# Patient Record
Sex: Male | Born: 1980 | Race: Black or African American | Hispanic: No | Marital: Single | Smoking: Former smoker
Health system: Southern US, Community
[De-identification: ages and names within clinical notes are randomized; demographics above are authoritative.]

## PROBLEM LIST (undated history)

## (undated) DIAGNOSIS — M1990 Unspecified osteoarthritis, unspecified site: Secondary | ICD-10-CM

## (undated) DIAGNOSIS — I10 Essential (primary) hypertension: Secondary | ICD-10-CM

## (undated) HISTORY — PX: KNEE ARTHROPLASTY: SHX992

## (undated) HISTORY — DX: Unspecified osteoarthritis, unspecified site: M19.90

---

## 2011-08-06 ENCOUNTER — Ambulatory Visit: Payer: Medicaid Other

## 2011-08-12 ENCOUNTER — Ambulatory Visit: Payer: Medicaid Other | Attending: Anesthesiology | Admitting: Physical Therapy

## 2011-10-14 ENCOUNTER — Emergency Department (HOSPITAL_COMMUNITY)
Admission: EM | Admit: 2011-10-14 | Discharge: 2011-10-14 | Disposition: A | Discharge status: Home / Self Care | Payer: Medicaid Other | Attending: Emergency Medicine | Admitting: Emergency Medicine

## 2011-10-14 ENCOUNTER — Encounter (HOSPITAL_COMMUNITY): Payer: Self-pay | Admitting: Emergency Medicine

## 2011-10-14 DIAGNOSIS — Y92009 Unspecified place in unspecified non-institutional (private) residence as the place of occurrence of the external cause: Secondary | ICD-10-CM | POA: Insufficient documentation

## 2011-10-14 DIAGNOSIS — W57XXXA Bitten or stung by nonvenomous insect and other nonvenomous arthropods, initial encounter: Secondary | ICD-10-CM

## 2011-10-14 DIAGNOSIS — L089 Local infection of the skin and subcutaneous tissue, unspecified: Secondary | ICD-10-CM | POA: Insufficient documentation

## 2011-10-14 MED ORDER — CEPHALEXIN 500 MG PO CAPS: 500.0000 mg | ORAL_CAPSULE | Freq: Three times a day (TID) | ORAL | Status: AC

## 2011-10-14 MED ORDER — CEPHALEXIN 250 MG PO CAPS
500.0000 mg | ORAL_CAPSULE | Freq: Once | ORAL | Status: AC
  Administered 2011-10-14: 500 mg via ORAL
  Filled 2011-10-14: qty 2

## 2011-10-14 NOTE — ED Notes (Signed)
The pt has pain in his rt arm swollen and difficult to move

## 2011-10-14 NOTE — ED Provider Notes (Signed)
History     CSN: [id number]  Arrival date & time 10/14/11  4098   First MD Initiated Contact with Patient 10/14/11 2050      Chief Complaint  Patient presents with  . Skin Problem    (Consider location/radiation/quality/duration/timing/severity/associated sxs/prior treatment) HPI Comments: Patient states that he thinks that he is getting bitten in the night by unknown insect - states awoke this morning with 3 small red marks on his right arm but as the day progressed the redness and swelling has increased and spread - he denies fever, chills, nausea, vomiting, numbness or tingling - he has full ROM to all joints.  Patient is a 31 y.o. male presenting with rash. The history is provided by the patient. No language interpreter was used.  Rash  This is a new problem. The current episode started 2 days ago. The problem has not changed since onset.The problem is associated with an insect bite/sting. There has been no fever. The rash is present on the right arm. The pain is at a severity of 3/10. The pain is moderate. The pain has been constant since onset. Associated symptoms include itching and pain. Pertinent negatives include no blisters and no weeping. He has tried nothing for the symptoms. The treatment provided no relief.    History reviewed. No pertinent past medical history.  History reviewed. No pertinent past surgical history.  History reviewed. No pertinent family history.  History  Substance Use Topics  . Smoking status: Not on file  . Smokeless tobacco: Not on file  . Alcohol Use: 3.6 oz/week    6 Cans of beer per week      Review of Systems  Constitutional: Negative for fever and chills.  HENT: Negative for neck pain.   Eyes: Negative for pain.  Respiratory: Negative for chest tightness and shortness of breath.   Cardiovascular: Negative for chest pain.  Gastrointestinal: Negative for nausea, vomiting and abdominal pain.  Genitourinary: Negative for dysuria.    Musculoskeletal: Negative for back pain.  Skin: Positive for itching and rash.  Neurological: Negative for headaches.  All other systems reviewed and are negative.    Allergies  Review of patient's allergies indicates no known allergies.  Home Medications  No current outpatient prescriptions on file.  BP 131/71  Pulse 75  Temp 98.4 F (36.9 C) (Oral)  Resp 18  SpO2 95%  Physical Exam  Nursing note and vitals reviewed. Constitutional: He is oriented to person, place, and time. He appears well-developed and well-nourished. No distress.  HENT:  Head: Normocephalic and atraumatic.  Right Ear: External ear normal.  Left Ear: External ear normal.  Nose: Nose normal.  Mouth/Throat: Oropharynx is clear and moist. No oropharyngeal exudate.  Eyes: Conjunctivae are normal. Pupils are equal, round, and reactive to light. No scleral icterus.  Neck: Normal range of motion. Neck supple.  Cardiovascular: Normal rate, regular rhythm and normal heart sounds.  Exam reveals no gallop and no friction rub.   No murmur heard. Pulmonary/Chest: Effort normal and breath sounds normal. No respiratory distress. He has no wheezes. He has no rales.  Abdominal: Soft. Bowel sounds are normal. He exhibits no distension. There is no tenderness.  Musculoskeletal: Normal range of motion. He exhibits edema and tenderness.  Lymphadenopathy:    He has no cervical adenopathy.  Neurological: He is alert and oriented to person, place, and time. No cranial nerve deficit.  Skin: Skin is warm and dry. No rash noted. There is erythema. No pallor.  Three small insect bites with 3 cm induration surrounding - no fluctuance.  Psychiatric: He has a normal mood and affect. His behavior is normal. Judgment and thought content normal.    ED Course  Procedures (including critical care time)  Labs Reviewed - No data to display No results found.   Infected insect bites    MDM  Patient with insect bites with  surrounding cellulitis - no evidence of systemic infection - will place on abx.        Izola Price [place], [place] 10/14/11 2101

## 2011-10-14 NOTE — ED Notes (Signed)
Pt reports woke up this morning and right arm was swollen and there was a bite. He reports tightness and soreness in that arm.

## 2011-10-15 NOTE — ED Provider Notes (Signed)
Medical screening examination/treatment/procedure(s) were performed by non-physician practitioner and as supervising physician I was immediately available for consultation/collaboration.  Flint Melter, MD 10/15/11 [phone]

## 2013-07-13 ENCOUNTER — Ambulatory Visit (INDEPENDENT_AMBULATORY_CARE_PROVIDER_SITE_OTHER): Payer: Medicaid Other | Admitting: Podiatry

## 2013-07-13 ENCOUNTER — Encounter: Payer: Self-pay | Admitting: Podiatry

## 2013-07-13 ENCOUNTER — Ambulatory Visit: Payer: Medicaid Other

## 2013-07-13 ENCOUNTER — Ambulatory Visit (INDEPENDENT_AMBULATORY_CARE_PROVIDER_SITE_OTHER): Payer: Medicaid Other

## 2013-07-13 VITALS — BP 142/78 | HR 75 | Resp 19 | Ht 74.0 in | Wt 266.0 lb

## 2013-07-13 DIAGNOSIS — Q6650 Congenital pes planus, unspecified foot: Secondary | ICD-10-CM

## 2013-07-13 DIAGNOSIS — M79609 Pain in unspecified limb: Secondary | ICD-10-CM

## 2013-07-13 DIAGNOSIS — M79606 Pain in leg, unspecified: Secondary | ICD-10-CM

## 2013-07-13 DIAGNOSIS — Q828 Other specified congenital malformations of skin: Secondary | ICD-10-CM

## 2013-07-13 MED ORDER — MELOXICAM 15 MG PO TABS: 15.0000 mg | ORAL_TABLET | Freq: Every day | ORAL | Status: DC

## 2013-07-13 MED ORDER — METHYLPREDNISOLONE (PAK) 4 MG PO TABS
ORAL_TABLET | ORAL | Status: DC
Start: 2013-07-13 — End: 2015-05-04

## 2013-07-13 NOTE — Progress Notes (Signed)
   Subjective:    Patient ID: Max Leach, male    DOB: 07/12/1980, 33 y.o.   MRN: [id number] Pt states has been having bottom of the feet burning, stinging, and occasional dull pain for over 1 month, and left lateral midfoot pain.  Pt states has to walk for his transportation.  Pt has a history of knee problems, and has been seen at [facility]. HPI    Review of Systems  Constitutional: Negative.   HENT: Negative.   Eyes: Negative.   Respiratory: Negative.   Cardiovascular: Positive for leg swelling.       Calf pain due to changing gait due to knee problems per pt.  Gastrointestinal: Negative.   Endocrine: Positive for polydipsia.  Genitourinary: Negative.   Musculoskeletal: Positive for arthralgias, back pain, gait problem and myalgias.  Skin: Negative.   Allergic/Immunologic: Positive for food allergies.       Crab causes facial swelling and rash.  Neurological: Positive for light-headedness and numbness.  Hematological: Bruises/bleeds easily.  Psychiatric/Behavioral: Negative.        Objective:   Physical Exam: I have reviewed his past history medications allergies surgeries and social history. Also stable he is alert and oriented x3. Pulses are palpable bilateral. Neurologic sensorium is intact bilateral. Orthopedic evaluation demonstrates all joints distal to the ankle a full range of motion with flexible pes planus bilateral. Cutaneous evaluation demonstrates dry xerotic tissue with porokeratotic lesions to the distal aspect of the right foot lateral aspect of the left foot which are painful on debridement today. He also has pain on range of motion of the subtalar joint and the forefoot more than likely associated with accommodating his painful lesions.        Assessment & Plan:  Assessment: Porokeratosis and pes planus bilateral.  Plan: Debridement reactive hyperkeratosis for him today and applied salicylic acid under occlusion to be left on for 3 days before  being removed I also wrote her prescription for a Medrol Dosepak and I will followup with him in 4 weeks

## 2013-08-12 ENCOUNTER — Encounter: Payer: Self-pay | Admitting: Podiatry

## 2013-08-12 ENCOUNTER — Ambulatory Visit (INDEPENDENT_AMBULATORY_CARE_PROVIDER_SITE_OTHER): Payer: Medicaid Other | Admitting: Podiatry

## 2013-08-12 VITALS — BP 132/78 | HR 75 | Resp 16

## 2013-08-12 DIAGNOSIS — Q828 Other specified congenital malformations of skin: Secondary | ICD-10-CM

## 2013-08-12 DIAGNOSIS — D492 Neoplasm of unspecified behavior of bone, soft tissue, and skin: Secondary | ICD-10-CM

## 2013-08-12 MED ORDER — DICLOFENAC SODIUM 75 MG PO TBEC: 75.0000 mg | DELAYED_RELEASE_TABLET | Freq: Two times a day (BID) | ORAL | Status: DC

## 2013-08-12 NOTE — Progress Notes (Signed)
He presents today for followup of his pes planus as well as his porokeratotic lesions to the posterior aspect of the bilateral foot. He states that the prednisone worked very well however [employer] doesn't seem to be doing very much for him.  Objective: Vital signs are stable he is alert and oriented x3 pulses are palpable. Porokeratotic lesions particularly one of the left foot is feeling 100% better however the right foot which did not heal off with the acid treatment is still painful. I debrided back down to normal tissue today and his symptoms were relieved 100%.  Assessment: Porokeratosis bilateral.  Plan: Wrote another prescription for diclofenac discontinue medic debridement reactive hyperkeratosis his and applied salicylic acid under occlusion for 4 days. I will followup with him in one month for repeat debridement and consider orthotics.

## 2013-09-16 ENCOUNTER — Ambulatory Visit (INDEPENDENT_AMBULATORY_CARE_PROVIDER_SITE_OTHER): Payer: Medicaid Other | Admitting: Podiatry

## 2013-09-16 ENCOUNTER — Encounter: Payer: Self-pay | Admitting: Podiatry

## 2013-09-16 VITALS — BP 122/68 | HR 78 | Resp 16

## 2013-09-16 DIAGNOSIS — Q828 Other specified congenital malformations of skin: Secondary | ICD-10-CM

## 2013-09-16 DIAGNOSIS — Q6650 Congenital pes planus, unspecified foot: Secondary | ICD-10-CM

## 2013-09-16 DIAGNOSIS — M79609 Pain in unspecified limb: Secondary | ICD-10-CM

## 2013-09-16 DIAGNOSIS — M79606 Pain in leg, unspecified: Secondary | ICD-10-CM

## 2013-09-16 DIAGNOSIS — D492 Neoplasm of unspecified behavior of bone, soft tissue, and skin: Secondary | ICD-10-CM

## 2013-09-16 NOTE — Progress Notes (Signed)
He presents today for followup of the porokeratosis plantar aspect of the right forefoot and the left plantar lateral foot. His complaining of numbness and some sharp pains to the first metatarsophalangeal joint and the IP joint of the right hallux. He denies fever chills nausea vomiting states it is the feeling much better.  Objective: Vital signs are stable he is alert and oriented x3 pulses are strongly palpable bilateral. Porokeratotic lesions plantar aspect of the bilateral foot the nucleated today and salicylic acid under occlusion was applied. I evaluated the first metatarsophalangeal joint which does demonstrate hallux abductovalgus deformity as well as hallux limitus. It is this limitation in range of motion at the metatarsophalangeal joint is resulting in a reactive hyperkeratosis to the medial IP joint and the neuritis tear and associated.  Assessment: Is hallux limitus first metatarsophalangeal joint bilateral. Porokeratosis plantar aspect of the bilateral foot.  Plan: Chemical destruction of lesion plantar aspect of the bilateral foot we did discuss the possible need for surgical intervention regarding the first metatarsophalangeal joint bilateral. He would like to consider this sometime in the fall.

## 2013-10-28 ENCOUNTER — Ambulatory Visit (INDEPENDENT_AMBULATORY_CARE_PROVIDER_SITE_OTHER): Payer: Medicaid Other | Admitting: Podiatry

## 2013-10-28 DIAGNOSIS — M79609 Pain in unspecified limb: Secondary | ICD-10-CM

## 2013-10-28 DIAGNOSIS — M79606 Pain in leg, unspecified: Secondary | ICD-10-CM

## 2013-10-28 DIAGNOSIS — Q828 Other specified congenital malformations of skin: Secondary | ICD-10-CM

## 2013-10-28 NOTE — Progress Notes (Signed)
He presents today for followup of his porokeratosis to the plantar aspect of the bilateral foot. He states him in the majority of the pain the right foot seems to have moved to the left foot nail.  Objective: Vital signs are stable he is alert and oriented x3. Pulses are palpable bilateral. Sub-second metatarsal head porokeratotic is present but much less thick than the last time he was in. This is looking much better. However the contralateral foot sub-fifth metatarsal base does demonstrate a porokeratotic lesion it is exquisitely tender on palpation.  Assessment: Porokeratosis bilateral.  Plan: Chemical destruction soft tissue benign lesion porokeratotic lesion with salicylic acid under occlusion. She will leave this on for 3 days keep it dry and then washed off thoroughly I will followup with him in 6-8 weeks.

## 2013-12-30 ENCOUNTER — Ambulatory Visit: Payer: Medicaid Other | Admitting: Podiatry

## 2014-01-13 ENCOUNTER — Ambulatory Visit: Payer: Medicaid Other | Admitting: Podiatry

## 2014-03-07 ENCOUNTER — Other Ambulatory Visit: Payer: Self-pay | Admitting: Podiatry

## 2015-05-02 ENCOUNTER — Ambulatory Visit: Payer: Medicaid Other | Admitting: Podiatry

## 2015-05-04 ENCOUNTER — Encounter: Payer: Self-pay | Admitting: Podiatry

## 2015-05-04 ENCOUNTER — Ambulatory Visit (INDEPENDENT_AMBULATORY_CARE_PROVIDER_SITE_OTHER): Payer: Medicaid Other | Admitting: Podiatry

## 2015-05-04 VITALS — BP 122/62 | HR 66 | Resp 16

## 2015-05-04 DIAGNOSIS — Q828 Other specified congenital malformations of skin: Secondary | ICD-10-CM

## 2015-05-04 DIAGNOSIS — D492 Neoplasm of unspecified behavior of bone, soft tissue, and skin: Secondary | ICD-10-CM | POA: Diagnosis not present

## 2015-05-04 DIAGNOSIS — Q6650 Congenital pes planus, unspecified foot: Secondary | ICD-10-CM

## 2015-05-04 NOTE — Progress Notes (Signed)
He presents today for follow-up of his porokeratotic lesions plantar aspect of bilateral foot. He states that they get very good this time approximate 7 months it lasted.  Objective: Pulses are palpable. Reactive porokeratotic lesions plantar aspect sub-second right inside IP joint hallux right are the worst and he does have smaller lesions throughout the left foot as well.  Assessment: Porokeratosis bilateral pain in limb bilateral.  Plan: Chemical debridement of these lesions after mechanical debridement of the lesions. Follow up with him as needed.

## 2015-08-18 ENCOUNTER — Encounter (HOSPITAL_COMMUNITY): Payer: Self-pay | Admitting: Emergency Medicine

## 2015-08-18 DIAGNOSIS — M79632 Pain in left forearm: Secondary | ICD-10-CM | POA: Diagnosis not present

## 2015-08-18 DIAGNOSIS — R600 Localized edema: Secondary | ICD-10-CM | POA: Insufficient documentation

## 2015-08-18 DIAGNOSIS — F17200 Nicotine dependence, unspecified, uncomplicated: Secondary | ICD-10-CM | POA: Diagnosis not present

## 2015-08-18 DIAGNOSIS — M7989 Other specified soft tissue disorders: Secondary | ICD-10-CM | POA: Diagnosis present

## 2015-08-18 LAB — CBC WITH DIFFERENTIAL/PLATELET
BASOS ABS: 0 10*3/uL (ref 0.0–0.1)
BASOS PCT: 0 %
Eosinophils Absolute: 0.3 10*3/uL (ref 0.0–0.7)
Eosinophils Relative: 5 %
HEMATOCRIT: 37.1 % — AB (ref 39.0–52.0)
HEMOGLOBIN: 12.4 g/dL — AB (ref 13.0–17.0)
LYMPHS PCT: 36 %
Lymphs Abs: 1.9 10*3/uL (ref 0.7–4.0)
MCH: 27 pg (ref 26.0–34.0)
MCHC: 33.4 g/dL (ref 30.0–36.0)
MCV: 80.8 fL (ref 78.0–100.0)
Monocytes Absolute: 0.7 10*3/uL (ref 0.1–1.0)
Monocytes Relative: 13 %
NEUTROS ABS: 2.4 10*3/uL (ref 1.7–7.7)
NEUTROS PCT: 46 %
Platelets: 171 10*3/uL (ref 150–400)
RBC: 4.59 MIL/uL (ref 4.22–5.81)
RDW: 13.7 % (ref 11.5–15.5)
WBC: 5.2 10*3/uL (ref 4.0–10.5)

## 2015-08-18 LAB — COMPREHENSIVE METABOLIC PANEL
ALBUMIN: 3.6 g/dL (ref 3.5–5.0)
ALK PHOS: 58 U/L (ref 38–126)
ALT: 22 U/L (ref 17–63)
AST: 32 U/L (ref 15–41)
Anion gap: 9 (ref 5–15)
BILIRUBIN TOTAL: 0.4 mg/dL (ref 0.3–1.2)
BUN: 13 mg/dL (ref 6–20)
CALCIUM: 9.2 mg/dL (ref 8.9–10.3)
CO2: 24 mmol/L (ref 22–32)
CREATININE: 1.09 mg/dL (ref 0.61–1.24)
Chloride: 110 mmol/L (ref 101–111)
GFR calc Af Amer: >60 mL/min (ref >60)
GLUCOSE: 127 mg/dL — AB (ref 65–99)
POTASSIUM: 4 mmol/L (ref 3.5–5.1)
Sodium: 143 mmol/L (ref 135–145)
TOTAL PROTEIN: 6.5 g/dL (ref 6.5–8.1)

## 2015-08-18 NOTE — ED Notes (Signed)
Pt. reports bilateral lower legs edema/swelling onset this week with intermittent left forearm pain, denies injury , no SOB .

## 2015-08-19 ENCOUNTER — Emergency Department (HOSPITAL_COMMUNITY)
Admission: EM | Admit: 2015-08-19 | Discharge: 2015-08-19 | Disposition: A | Discharge status: Home / Self Care | Payer: Medicaid Other | Attending: Emergency Medicine | Admitting: Emergency Medicine

## 2015-08-19 ENCOUNTER — Encounter (HOSPITAL_COMMUNITY): Payer: Self-pay | Admitting: Emergency Medicine

## 2015-08-19 DIAGNOSIS — R609 Edema, unspecified: Secondary | ICD-10-CM

## 2015-08-19 DIAGNOSIS — T148XXA Other injury of unspecified body region, initial encounter: Secondary | ICD-10-CM

## 2015-08-19 MED ORDER — NAPROXEN 375 MG PO TABS: 375.0000 mg | ORAL_TABLET | Freq: Two times a day (BID) | ORAL | Status: DC

## 2015-08-19 MED ORDER — NAPROXEN 250 MG PO TABS
500.0000 mg | ORAL_TABLET | Freq: Once | ORAL | Status: AC
  Administered 2015-08-19: 500 mg via ORAL
  Filled 2015-08-19: qty 2

## 2015-08-19 NOTE — Discharge Instructions (Signed)
Edema °Edema is an abnormal buildup of fluids. It is more common in your legs and thighs. Painless swelling of the feet and ankles is more likely as a person ages. It also is common in looser skin, like around your eyes. °HOME CARE  °· Keep the affected body part above the level of the heart while lying down. °· Do not sit still or stand for a long time. °· Do not put anything right under your knees when you lie down. °· Do not wear tight clothes on your upper legs. °· Exercise your legs to help the puffiness (swelling) go down. °· Wear elastic bandages or support stockings as told by your doctor. °· A low-salt diet may help lessen the puffiness. °· Only take medicine as told by your doctor. °GET HELP IF: °· Treatment is not working. °· You have heart, liver, or kidney disease and notice that your skin looks puffy or shiny. °· You have puffiness in your legs that does not get better when you raise your legs. °· You have sudden weight gain for no reason. °GET HELP RIGHT AWAY IF:  °· You have shortness of breath or chest pain. °· You cannot breathe when you lie down. °· You have pain, redness, or warmth in the areas that are puffy. °· You have heart, liver, or kidney disease and get edema all of a sudden. °· You have a fever and your symptoms get worse all of a sudden. °MAKE SURE YOU:  °· Understand these instructions. °· Will watch your condition. °· Will get help right away if you are not doing well or get worse. °  °This information is not intended to replace advice given to you by your health care provider. Make sure you discuss any questions you have with your health care provider. °  °Document Released: 10/02/2007 Document Revised: 04/20/2013 Document Reviewed: 02/05/2013 °Elsevier Interactive Patient Education ©2016 [employer] ° °

## 2015-08-19 NOTE — ED Provider Notes (Signed)
CSN: [id number]     Arrival date & time 08/18/15  2119 History   By signing my name below, I, Max Leach, attest that this documentation has been prepared under the direction and in the presence of Max Hoston, MD.  Electronically Signed: Forrestine Leach, ED Scribe. 08/19/2015. 2:09 AM.   Chief Complaint  Patient presents with  . Leg Swelling   Patient is a 35 y.o. male presenting with extremity pain. The history is provided by the patient. No language interpreter was used.  Extremity Pain This is a new problem. The current episode started yesterday. The problem occurs constantly. The problem has not changed since onset.Pertinent negatives include no chest pain, no abdominal pain, no headaches and no shortness of breath. Nothing aggravates the symptoms. Nothing relieves the symptoms. He has tried nothing for the symptoms.    HPI Comments: Max Leach is a 35 y.o. male without any pertinent past medical history who presents to the Emergency Department complaining of intermittent, ongoing swelling to bilateral lower extremities at the ankles; R greater than L  x 1 week. No recent injury or trauma. No aggravating or alleviating factors reported. Pt also reports ongoing pain to the L forearm x 2-3 days. No OTC medication or home remedies attempted prior to arrival. No recent fever, chills, nausea, vomiting, or diarrhea. Pt admits to walking a lot and often lifts weight at the gym. No known allergies to medications. No CP no SOB no DOE no pain in the legs.  Pain in the left forearm post push ups  PCP: Max Fendt, MD    Past Medical History  Diagnosis Date  . Arthritis     r knee   Past Surgical History  Procedure Laterality Date  . Knee arthroplasty Right    Family History  Problem Relation Age of Onset  . Arthritis Mother    Social History  Substance Use Topics  . Smoking status: Current Every Day Smoker -- 0.00 packs/day  . Smokeless tobacco: None  . Alcohol Use: Yes     Review of Systems  Respiratory: Negative for cough and shortness of breath.   Cardiovascular: Positive for leg swelling. Negative for chest pain.  Gastrointestinal: Negative for nausea, vomiting, abdominal pain and diarrhea.  Musculoskeletal: Positive for arthralgias.  Neurological: Negative for headaches.  Psychiatric/Behavioral: Negative for confusion.  All other systems reviewed and are negative.     Allergies  Shrimp  Home Medications   Prior to Admission medications   Medication Sig Start Date End Date Taking? Authorizing Provider  cyclobenzaprine (FLEXERIL) 10 MG tablet Take 10 mg by mouth 3 (three) times daily as needed for muscle spasms.    Historical Provider, MD   Triage Vitals: BP 128/79 mmHg  Pulse 56  Temp(Src) 97.7 F (36.5 C) (Oral)  Resp 22  Ht 6\' 2"  (1.88 m)  Wt 296 lb (134.265 kg)  BMI 37.99 kg/m2  SpO2 97%   Physical Exam  Constitutional: He is oriented to person, place, and time. He appears well-developed and well-nourished.  HENT:  Head: Normocephalic and atraumatic.  Mouth/Throat: Oropharynx is clear and moist.  Eyes: EOM are normal. Pupils are equal, round, and reactive to light.  Neck: Normal range of motion. Neck supple.  Trachea is midline No bruits   Cardiovascular: Normal rate, regular rhythm, normal heart sounds and intact distal pulses.   Pulses:      Dorsalis pedis pulses are 2+ on the right side, and 2+ on the left side.  Pulmonary/Chest: Effort  normal and breath sounds normal. No stridor. No respiratory distress. He has no wheezes. He has no rales.  Abdominal: Soft. Bowel sounds are normal. He exhibits no distension. There is no tenderness. There is no rebound and no guarding.  Musculoskeletal: Normal range of motion. He exhibits edema. He exhibits no tenderness.  Dependant edema, non pitting  Negative Homan's sign all compartments soft.   No snuff box left wrist L arm is N/V Triceps and biceps intact at 5/5 bilaterally    Neurological: He is alert and oriented to person, place, and time. He has normal reflexes.  Skin: Skin is warm and dry.  Psychiatric: He has a normal mood and affect. Judgment normal.  Nursing note and vitals reviewed.   ED Course  Procedures (including critical care time)  DIAGNOSTIC STUDIES: Oxygen Saturation is 97% on RA, Normal by my interpretation.    COORDINATION OF CARE: 2:02 AM- Will order blood work. Discussed treatment plan with pt at bedside and pt agreed to plan.     Labs Review Labs Reviewed  CBC WITH DIFFERENTIAL/PLATELET - Abnormal; Notable for the following:    Hemoglobin 12.4 (*)    HCT 37.1 (*)    All other components within normal limits  COMPREHENSIVE METABOLIC PANEL - Abnormal; Notable for the following:    Glucose, Bld 127 (*)    All other components within normal limits    Imaging Review No results found. I have personally reviewed and evaluated these images and lab results as part of my medical decision-making.   EKG Interpretation None      MDM   Final diagnoses:  None     Filed Vitals:   08/19/15 0007 08/19/15 0205  BP: 128/79 125/80  Pulse: 56 58  Temp: 97.7 F (36.5 C)   Resp: 22 18    Results for orders placed or performed during the hospital encounter of 08/19/15  CBC with Differential  Result Value Ref Range   WBC 5.2 4.0 - 10.5 K/uL   RBC 4.59 4.22 - 5.81 MIL/uL   Hemoglobin 12.4 (L) 13.0 - 17.0 g/dL   HCT 37.1 (L) 39.0 - 52.0 %   MCV 80.8 78.0 - 100.0 fL   MCH 27.0 26.0 - 34.0 pg   MCHC 33.4 30.0 - 36.0 g/dL   RDW 13.7 11.5 - 15.5 %   Platelets 171 150 - 400 K/uL   Neutrophils Relative % 46 %   Neutro Abs 2.4 1.7 - 7.7 K/uL   Lymphocytes Relative 36 %   Lymphs Abs 1.9 0.7 - 4.0 K/uL   Monocytes Relative 13 %   Monocytes Absolute 0.7 0.1 - 1.0 K/uL   Eosinophils Relative 5 %   Eosinophils Absolute 0.3 0.0 - 0.7 K/uL   Basophils Relative 0 %   Basophils Absolute 0.0 0.0 - 0.1 K/uL  Comprehensive metabolic panel   Result Value Ref Range   Sodium 143 135 - 145 mmol/L   Potassium 4.0 3.5 - 5.1 mmol/L   Chloride 110 101 - 111 mmol/L   CO2 24 22 - 32 mmol/L   Glucose, Bld 127 (H) 65 - 99 mg/dL   BUN 13 6 - 20 mg/dL   Creatinine, Ser 1.09 0.61 - 1.24 mg/dL   Calcium 9.2 8.9 - 10.3 mg/dL   Total Protein 6.5 6.5 - 8.1 g/dL   Albumin 3.6 3.5 - 5.0 g/dL   AST 32 15 - 41 U/L   ALT 22 17 - 63 U/L   Alkaline Phosphatase 58 38 -  126 U/L   Total Bilirubin 0.4 0.3 - 1.2 mg/dL   GFR calc non Af Amer >60 >60 mL/min   GFR calc Af Amer >60 >60 mL/min   Anion gap 9 5 - 15    Filed Vitals:   08/19/15 0230 08/19/15 0245  BP: 140/76 134/87  Pulse: 48 54  Temp:    Resp:     Medications  naproxen (NAPROSYN) tablet 500 mg (500 mg Oral Given 08/19/15 0221)    MSK pain in the forearm NSAIDs and ice and hold push ups and heavy lifting.  Dependent edema elevate legs and ice ankles.  Close follow up strict return precautions  I personally performed the services described in this documentation, which was scribed in my presence. The recorded information has been reviewed and is accurate.     Veatrice Kells, MD 08/19/15 [phone]

## 2015-08-19 NOTE — ED Notes (Signed)
Pt departed in NAD.  

## 2015-10-05 ENCOUNTER — Encounter: Payer: Self-pay | Admitting: Podiatry

## 2015-10-05 ENCOUNTER — Ambulatory Visit (INDEPENDENT_AMBULATORY_CARE_PROVIDER_SITE_OTHER): Payer: Medicaid Other | Admitting: Podiatry

## 2015-10-05 DIAGNOSIS — Q828 Other specified congenital malformations of skin: Secondary | ICD-10-CM

## 2015-10-05 DIAGNOSIS — Q6650 Congenital pes planus, unspecified foot: Secondary | ICD-10-CM | POA: Diagnosis not present

## 2015-10-05 NOTE — Progress Notes (Signed)
He presents today chief complaint of painful feet bilateral. He states that he back to have his feet scraped.  Objective: All signs are stable he is alert and oriented 3. Pulses are palpable. Pes planus is noted bilateral with multiple porokeratosis.  Assessment: Multiple porokeratosis plantar aspect of the bilateral foot with pes planus.  Plan: I debrided the reactive hyperkeratotic lesions mechanically today and place salicylic acid under occlusion to be left on for 3 days without getting this wet. He is to then washed off the salicylic acid and follow up with me on an as-needed basis.

## 2015-11-21 ENCOUNTER — Ambulatory Visit (INDEPENDENT_AMBULATORY_CARE_PROVIDER_SITE_OTHER): Payer: Medicaid Other

## 2015-11-21 ENCOUNTER — Encounter: Payer: Self-pay | Admitting: Podiatry

## 2015-11-21 ENCOUNTER — Ambulatory Visit (INDEPENDENT_AMBULATORY_CARE_PROVIDER_SITE_OTHER): Payer: Medicaid Other | Admitting: Podiatry

## 2015-11-21 VITALS — BP 118/69 | HR 65 | Resp 16

## 2015-11-21 DIAGNOSIS — M779 Enthesopathy, unspecified: Secondary | ICD-10-CM | POA: Insufficient documentation

## 2015-11-21 DIAGNOSIS — M79672 Pain in left foot: Secondary | ICD-10-CM

## 2015-11-21 DIAGNOSIS — M7750 Other enthesopathy of unspecified foot: Secondary | ICD-10-CM | POA: Diagnosis not present

## 2015-11-21 MED ORDER — MELOXICAM 15 MG PO TABS: 15.0000 mg | ORAL_TABLET | Freq: Every day | ORAL | 3 refills | Status: DC

## 2015-11-22 NOTE — Progress Notes (Signed)
He presents today with chief complaint of pain dorsal aspect of the left foot. He states that it started after working out at the gym very hard last week. He denies any trauma to the foot no injuries.  Objective: Vital signs are stable alert and oriented 3. Pulses are palpable. Dorsal aspect of the foot is moderately swollen with some changes in skin color and texture around the metatarsophalangeal joints. It is painful for him to dorsiflex the toes or for me to manually plantarflex the toes. He has tenderness on palpation of the second third and fourth metatarsal heads. Radiographs taken today do not demonstrate any type of osseus abnormalities however we cannot be sure that there are stress fractures there. He has no pain on plantar palpation of each individual metatarsal head.  Assessment: This appears to be an extensor tendinitis of the left foot.  Plan: I recommended he follow up with [place] in a few weeks for another set of x-rays. Until then I recommended anti-inflammatories which is centered over meloxicam 15 mg #30 one by mouth daily.

## 2016-06-11 ENCOUNTER — Encounter (HOSPITAL_COMMUNITY): Payer: Self-pay | Admitting: *Deleted

## 2016-06-11 ENCOUNTER — Emergency Department (HOSPITAL_COMMUNITY): Payer: Medicaid Other

## 2016-06-11 ENCOUNTER — Emergency Department (HOSPITAL_COMMUNITY)
Admission: EM | Admit: 2016-06-11 | Discharge: 2016-06-11 | Disposition: A | Discharge status: Home / Self Care | Payer: Medicaid Other | Attending: Emergency Medicine | Admitting: Emergency Medicine

## 2016-06-11 DIAGNOSIS — M25561 Pain in right knee: Secondary | ICD-10-CM | POA: Insufficient documentation

## 2016-06-11 DIAGNOSIS — F17200 Nicotine dependence, unspecified, uncomplicated: Secondary | ICD-10-CM | POA: Insufficient documentation

## 2016-06-11 DIAGNOSIS — M25461 Effusion, right knee: Secondary | ICD-10-CM | POA: Diagnosis present

## 2016-06-11 DIAGNOSIS — Z79899 Other long term (current) drug therapy: Secondary | ICD-10-CM | POA: Insufficient documentation

## 2016-06-11 MED ORDER — PREDNISONE 10 MG (21) PO TBPK: 10.0000 mg | ORAL_TABLET | Freq: Every day | ORAL | 0 refills | Status: DC

## 2016-06-11 MED ORDER — PREDNISONE 20 MG PO TABS
60.0000 mg | ORAL_TABLET | Freq: Once | ORAL | Status: AC
  Administered 2016-06-11: 60 mg via ORAL
  Filled 2016-06-11: qty 3

## 2016-06-11 NOTE — ED Triage Notes (Signed)
Pt reports onset of swelling in both legs for 4 days and sharp pain/stiffnes in the right knee, denies injury.  Pt denies cp or sob. No meds prior to arrival.

## 2016-06-11 NOTE — Discharge Instructions (Signed)
Rest for the next 2-3 days. Take the medications as directed. Follow-up with orthopedic surgeon

## 2016-06-11 NOTE — ED Provider Notes (Signed)
[facility] DEPT Provider Note   CSN: [id number] Arrival date & time: 06/11/16  1953     History   Chief Complaint Chief Complaint  Patient presents with  . Leg Swelling    HPI Hobbs Parola is a 36 y.o. male.  36 year old male presents with right knee swelling times several days. History of meniscal tear with arthroscopic surgery 10 years ago. No fever or chills. No recent history of trauma. Pain characterized as sharp and localized to the infrapatellar region. Better with rest. Denies any calf or thigh pain or swelling. No treatment use prior to arrival.      Past Medical History:  Diagnosis Date  . Arthritis    r knee    Patient Active Problem List   Diagnosis Date Noted  . Tendinitis 11/21/2015    Past Surgical History:  Procedure Laterality Date  . KNEE ARTHROPLASTY Right        Home Medications    Prior to Admission medications   Medication Sig Start Date End Date Taking? Authorizing Provider  meloxicam (MOBIC) 15 MG tablet Take 1 tablet (15 mg total) by mouth daily. Patient not taking: Reported on 06/11/2016 11/21/15   Max T Hyatt, DPM  naproxen (NAPROSYN) 375 MG tablet Take 1 tablet (375 mg total) by mouth 2 (two) times daily. Patient not taking: Reported on 06/11/2016 08/19/15   April Palumbo, MD    Family History Family History  Problem Relation Age of Onset  . Arthritis Mother     Social History Social History  Substance Use Topics  . Smoking status: Current Every Day Smoker    Packs/day: 0.00  . Smokeless tobacco: Current User  . Alcohol use Yes     Allergies   Shrimp [shellfish allergy]   Review of Systems Review of Systems  All other systems reviewed and are negative.    Physical Exam Updated Vital Signs BP 135/78 (BP Location: Right Arm)   Pulse (!) 57   Temp 98.3 F (36.8 C) (Oral)   Resp 24   Ht 6\' 1"  (1.854 m)   Wt 116.1 kg   SpO2 99%   BMI 33.78 kg/m   Physical Exam  Constitutional: He is oriented to  person, place, and time. He appears well-developed and well-nourished.  Non-toxic appearance. No distress.  HENT:  Head: Normocephalic and atraumatic.  Eyes: Conjunctivae, EOM and lids are normal. Pupils are equal, round, and reactive to light.  Neck: Normal range of motion. Neck supple. No tracheal deviation present. No thyroid mass present.  Cardiovascular: Normal rate, regular rhythm and normal heart sounds.  Exam reveals no gallop.   No murmur heard. Pulmonary/Chest: Effort normal and breath sounds normal. No stridor. No respiratory distress. He has no decreased breath sounds. He has no wheezes. He has no rhonchi. He has no rales.  Abdominal: Soft. Normal appearance and bowel sounds are normal. He exhibits no distension. There is no tenderness. There is no rebound and no CVA tenderness.  Musculoskeletal: Normal range of motion. He exhibits no edema or tenderness.       Legs: Neurological: He is alert and oriented to person, place, and time. He has normal strength. No cranial nerve deficit or sensory deficit. GCS eye subscore is 4. GCS verbal subscore is 5. GCS motor subscore is 6.  Skin: Skin is warm and dry. No abrasion and no rash noted.  Psychiatric: He has a normal mood and affect. His speech is normal and behavior is normal.  Nursing note and vitals reviewed.  ED Treatments / Results  Labs (all labs ordered are listed, but only abnormal results are displayed) Labs Reviewed - No data to display  EKG  EKG Interpretation None       Radiology Dg Knee Complete 4 Views Right  Result Date: 06/11/2016 CLINICAL DATA:  Acute onset of sharp anterior right knee pain and swelling. Initial encounter. EXAM: RIGHT KNEE - COMPLETE 4+ VIEW COMPARISON:  None. FINDINGS: There is no evidence of fracture or dislocation. Prominent marginal osteophytes are seen arising at all 3 compartments, with underlying mild cortical irregularity and sclerosis of the medial compartment. Tibial spine and wall  osteophytes are also seen. A small to moderate knee joint effusion is noted, with an apparent large loose body superior to the patella. The visualized soft tissues are normal in appearance. IMPRESSION: 1. No evidence of acute fracture or dislocation. 2. Tricompartmental osteoarthritis noted. 3. Small to moderate knee joint effusion, with apparent large loose body superior to the patella. Electronically Signed   By: Garald Balding M.D.   On: 06/11/2016 21:27    Procedures Procedures (including critical care time)  Medications Ordered in ED Medications - No data to display   Initial Impression / Assessment and Plan / ED Course  I have reviewed the triage vital signs and the nursing notes.  Pertinent labs & imaging results that were available during my care of the patient were reviewed by me and considered in my medical decision making (see chart for details).     Patient's x-rays noted. He is afebrile here. He does have a history of arthritis in the same joint nothing as a cause of this fluid. Slight effusion noted but not enough to do an arthrocentesis. Will give dose of prednisone here as well as prescription for same referral to orthopedics. Leg is not swollen and do not think that this represents DVT.  Final Clinical Impressions(s) / ED Diagnoses   Final diagnoses:  None    New Prescriptions New Prescriptions   No medications on file     Lacretia Leigh, MD 06/11/16 2242

## 2016-06-11 NOTE — ED Notes (Signed)
Ice provided for knee

## 2017-07-03 ENCOUNTER — Ambulatory Visit (HOSPITAL_COMMUNITY)
Admission: EM | Admit: 2017-07-03 | Discharge: 2017-07-03 | Disposition: A | Discharge status: Home / Self Care | Payer: Medicaid Other | Attending: Family Medicine | Admitting: Family Medicine

## 2017-07-03 ENCOUNTER — Other Ambulatory Visit: Payer: Self-pay

## 2017-07-03 ENCOUNTER — Encounter (HOSPITAL_COMMUNITY): Payer: Self-pay | Admitting: Emergency Medicine

## 2017-07-03 DIAGNOSIS — K0889 Other specified disorders of teeth and supporting structures: Secondary | ICD-10-CM | POA: Diagnosis not present

## 2017-07-03 MED ORDER — HYDROCODONE-ACETAMINOPHEN 5-325 MG PO TABS
1.0000 | ORAL_TABLET | Freq: Once | ORAL | Status: AC
  Administered 2017-07-03: 1 via ORAL

## 2017-07-03 MED ORDER — HYDROCODONE-ACETAMINOPHEN 5-325 MG PO TABS
ORAL_TABLET | ORAL | Status: AC
  Filled 2017-07-03: qty 1

## 2017-07-03 MED ORDER — AMOXICILLIN 875 MG PO TABS: 875.0000 mg | ORAL_TABLET | Freq: Two times a day (BID) | ORAL | 0 refills | Status: AC

## 2017-07-03 MED ORDER — IBUPROFEN 800 MG PO TABS: 800.0000 mg | ORAL_TABLET | Freq: Three times a day (TID) | ORAL | 0 refills | Status: DC

## 2017-07-03 NOTE — ED Triage Notes (Signed)
Pt c/o toothache on the R side x2 days

## 2017-07-03 NOTE — ED Provider Notes (Signed)
[facility]    CSN: [id number] Arrival date & time: 07/03/17  1448     History   Chief Complaint Chief Complaint  Patient presents with  . Dental Pain    HPI Creg Gilmer is a 37 y.o. male.   Jeanmarc presents with complaints of right upper jaw pain which started yesterday. Some swelling. Has been taking ibuprofen which has not helped, last dose this morning. Denies any previous similar. Pain causes entire jaw to be painful. Without fevers. Does not have a dentist. No known drainage. Rates pain 10/10. Without URI symptoms. Without any contributing medical history.  ROS per HPI.       Past Medical History:  Diagnosis Date  . Arthritis    r knee    Patient Active Problem List   Diagnosis Date Noted  . Tendinitis 11/21/2015    Past Surgical History:  Procedure Laterality Date  . KNEE ARTHROPLASTY Right        Home Medications    Prior to Admission medications   Medication Sig Start Date End Date Taking? Authorizing Provider  amoxicillin (AMOXIL) 875 MG tablet Take 1 tablet (875 mg total) by mouth 2 (two) times daily for 10 days. 07/03/17 07/13/17  Zigmund Gottron, NP  ibuprofen (ADVIL,MOTRIN) 800 MG tablet Take 1 tablet (800 mg total) by mouth 3 (three) times daily. 07/03/17   Zigmund Gottron, NP    Family History Family History  Problem Relation Age of Onset  . Arthritis Mother     Social History Social History   Tobacco Use  . Smoking status: Current Every Day Smoker    Packs/day: 0.00  . Smokeless tobacco: Current User  Substance Use Topics  . Alcohol use: Yes  . Drug use: Yes    Types: Marijuana     Allergies   Shrimp [shellfish allergy]   Review of Systems Review of Systems   Physical Exam Triage Vital Signs ED Triage Vitals  Enc Vitals Group     BP 07/03/17 1533 (!) 169/95     Pulse Rate 07/03/17 1533 64     Resp 07/03/17 1533 18     Temp 07/03/17 1533 98.2 F (36.8 C)     Temp Source 07/03/17 1533 Oral     SpO2  07/03/17 1533 98 %     Weight --      Height --      Head Circumference --      Peak Flow --      Pain Score 07/03/17 1536 10     Pain Loc --      Pain Edu? --      Excl. in [place]? --    No data found.  Updated Vital Signs BP (!) 169/95 (BP Location: Right Arm) Comment: Reported BP to Dr Robyn Haber  Pulse 64   Temp 98.2 F (36.8 C) (Oral)   Resp 18   SpO2 98%   Visual Acuity Right Eye Distance:   Left Eye Distance:   Bilateral Distance:    Right Eye Near:   Left Eye Near:    Bilateral Near:     Physical Exam  Constitutional: He is oriented to person, place, and time. He appears well-developed and well-nourished.  HENT:  Head:    Mouth/Throat: Uvula is midline and oropharynx is clear and moist.  significant Tenderness at jaw like to right upper molar; without visible caries, abscess or broken tooth at this time; mild soft tissue swelling  Cardiovascular: Normal rate and  regular rhythm.  Pulmonary/Chest: Effort normal and breath sounds normal.  Neurological: He is alert and oriented to person, place, and time.  Skin: Skin is warm and dry.     UC Treatments / Results  Labs (all labs ordered are listed, but only abnormal results are displayed) Labs Reviewed - No data to display  EKG  EKG Interpretation None       Radiology No results found.  Procedures Procedures (including critical care time)  Medications Ordered in UC Medications  HYDROcodone-acetaminophen (NORCO/VICODIN) 5-325 MG per tablet 1 tablet (not administered)     Initial Impression / Assessment and Plan / UC Course  I have reviewed the triage vital signs and the nursing notes.  Pertinent labs & imaging results that were available during my care of the patient were reviewed by me and considered in my medical decision making (see chart for details).     Patient rode bus, hydrocodone provided prior to departure. To continue with ibuprofen as needed for pain. Complete course of  antibiotics. Follow up with dentist for definitive treatment. Patient verbalized understanding and agreeable to plan.    Final Clinical Impressions(s) / UC Diagnoses   Final diagnoses:  Pain, dental    ED Discharge Orders        Ordered    amoxicillin (AMOXIL) 875 MG tablet  2 times daily     07/03/17 1603    ibuprofen (ADVIL,MOTRIN) 800 MG tablet  3 times daily     07/03/17 1603       Controlled Substance Prescriptions [place] Controlled Substance Registry consulted? Not Applicable   Zigmund Gottron, NP 07/03/17 1609

## 2017-07-03 NOTE — Discharge Instructions (Signed)
Ibuprofen for pain control Ice packs to area affected. Complete course of antibiotics.  Please see a dentist for definitive treatment.

## 2017-11-16 ENCOUNTER — Encounter (HOSPITAL_COMMUNITY): Payer: Self-pay

## 2017-11-16 ENCOUNTER — Emergency Department (HOSPITAL_COMMUNITY)
Admission: EM | Admit: 2017-11-16 | Discharge: 2017-11-16 | Disposition: A | Discharge status: Home / Self Care | Payer: Medicaid Other | Attending: Emergency Medicine | Admitting: Emergency Medicine

## 2017-11-16 DIAGNOSIS — Y9389 Activity, other specified: Secondary | ICD-10-CM | POA: Insufficient documentation

## 2017-11-16 DIAGNOSIS — F17200 Nicotine dependence, unspecified, uncomplicated: Secondary | ICD-10-CM | POA: Diagnosis not present

## 2017-11-16 DIAGNOSIS — Y999 Unspecified external cause status: Secondary | ICD-10-CM | POA: Diagnosis not present

## 2017-11-16 DIAGNOSIS — S40021A Contusion of right upper arm, initial encounter: Secondary | ICD-10-CM | POA: Diagnosis not present

## 2017-11-16 DIAGNOSIS — M79604 Pain in right leg: Secondary | ICD-10-CM | POA: Insufficient documentation

## 2017-11-16 DIAGNOSIS — S4991XA Unspecified injury of right shoulder and upper arm, initial encounter: Secondary | ICD-10-CM | POA: Diagnosis present

## 2017-11-16 DIAGNOSIS — Z79899 Other long term (current) drug therapy: Secondary | ICD-10-CM | POA: Insufficient documentation

## 2017-11-16 DIAGNOSIS — M79605 Pain in left leg: Secondary | ICD-10-CM | POA: Diagnosis not present

## 2017-11-16 DIAGNOSIS — W460XXA Contact with hypodermic needle, initial encounter: Secondary | ICD-10-CM | POA: Insufficient documentation

## 2017-11-16 DIAGNOSIS — S40022A Contusion of left upper arm, initial encounter: Secondary | ICD-10-CM | POA: Diagnosis not present

## 2017-11-16 DIAGNOSIS — Y929 Unspecified place or not applicable: Secondary | ICD-10-CM | POA: Diagnosis not present

## 2017-11-16 DIAGNOSIS — M79602 Pain in left arm: Secondary | ICD-10-CM

## 2017-11-16 DIAGNOSIS — S40029A Contusion of unspecified upper arm, initial encounter: Secondary | ICD-10-CM

## 2017-11-16 DIAGNOSIS — M79601 Pain in right arm: Secondary | ICD-10-CM

## 2017-11-16 MED ORDER — NAPROXEN 500 MG PO TABS: 500.0000 mg | ORAL_TABLET | Freq: Two times a day (BID) | ORAL | 0 refills | Status: DC

## 2017-11-16 NOTE — ED Notes (Signed)
Patient not in room or in ED for discharge paperwork.

## 2017-11-16 NOTE — ED Triage Notes (Signed)
Pt presents for bilateral arm pain. States gave plasma last week and had to be stuck in both arms, pt reports had some tenderness to IV site and bruising.

## 2017-11-16 NOTE — Discharge Instructions (Addendum)
Please see the information and instructions below regarding your visit.  Your diagnoses today include:  1. Bilateral arm pain   2. Contusion of upper extremity, unspecified laterality, initial encounter     Tests performed today include: See side panel of your discharge paperwork for testing performed today. Vital signs are listed at the bottom of these instructions.   Medications prescribed:    Take any prescribed medications only as prescribed, and any over the counter medications only as directed on the packaging.  You are prescribed naproxen, a non-steroidal anti-inflammatory agent (NSAID) for pain. You may take 500mg  every 12 hours. If still requiring this medication around the clock for acute pain after 10 days, please see your primary healthcare provider.  Women who are pregnant, breastfeeding, or planning on becoming pregnant should not take non-steroidal anti-inflammatories such as Advil and Aleve. Tylenol is a safe over the counter pain reliever in pregnant women.  You may combine this medication with Tylenol, 650 mg every 6 hours, so you are receiving something for pain every 3 hours.  This is not a long-term medication unless under the care and direction of your primary provider. Taking this medication long-term and not under the supervision of a healthcare provider could increase the risk of stomach ulcers, kidney problems, and cardiovascular problems such as high blood pressure.    Home care instructions:  Please follow any educational materials contained in this packet.   Follow-up instructions: Please follow-up with your primary care provider as needed for further evaluation of your symptoms if they are not completely improved.   Return instructions:  Please return to the Emergency Department if you experience worsening symptoms.  Please return to the emergency department if you develop any worsening swelling, or redness to your arms. Please return if you have any  other emergent concerns.  Additional Information:   Your vital signs today were: BP 140/86 (BP Location: Right Arm)    Pulse 62    Temp 98 F (36.7 C) (Oral)    Resp 20    SpO2 98%  If your blood pressure (BP) was elevated on multiple readings during this visit above 130 for the top number or above 80 for the bottom number, please have this repeated by your primary care provider within one month. --------------  Thank you for allowing [place] to participate in your care today.

## 2017-11-16 NOTE — ED Provider Notes (Signed)
[facility] EMERGENCY DEPARTMENT Provider Note   CSN: [id number] Arrival date & time: 11/16/17  4098     History   Chief Complaint Chief Complaint  Patient presents with  . Arm Pain    HPI Max Leach is a 37 y.o. male.  HPI  Patient is a 37 year old male with no significant past medical history presenting for bilateral arm pain.  Patient reports that 2 weeks ago, he gave plasma for the second time, and they were unsuccessful in their first attempt at IV insertion, and the attempt resulted in hematoma of the right arm, and then moved to the left arm.  Patient reports that the IV in the left arm "left the vein", and he noticed increasing swelling and bruising immediately after this occurred in the left arm.  Patient reports that his plasma session was terminated early due to these complications.  Patient reports that subsequently, he has had pain over the areas of bruising and swelling.  Patient denies any difficulty with passive range of motion of his arms.  Patient denies any erythema, increasing edema of the arms.  Patient denies any fevers or chills.  Patient denies any recent strenuous activity with his muscles.  Patient is also noting that he has "shooting pains" up bilateral arms and also bilateral legs, and is unsure if this is all related to his IV sticks.  Past Medical History:  Diagnosis Date  . Arthritis    r knee    Patient Active Problem List   Diagnosis Date Noted  . Tendinitis 11/21/2015    Past Surgical History:  Procedure Laterality Date  . KNEE ARTHROPLASTY Right         Home Medications    Prior to Admission medications   Medication Sig Start Date End Date Taking? Authorizing Provider  ibuprofen (ADVIL,MOTRIN) 800 MG tablet Take 1 tablet (800 mg total) by mouth 3 (three) times daily. 07/03/17   Zigmund Gottron, NP    Family History Family History  Problem Relation Age of Onset  . Arthritis Mother     Social History Social  History   Tobacco Use  . Smoking status: Current Every Day Smoker    Packs/day: 0.00  . Smokeless tobacco: Current User  Substance Use Topics  . Alcohol use: Yes  . Drug use: Yes    Types: Marijuana     Allergies   Shrimp [shellfish allergy]   Review of Systems Review of Systems  Constitutional: Negative for chills and fever.  Musculoskeletal: Positive for myalgias. Negative for joint swelling.  Skin: Positive for color change. Negative for rash and wound.  Neurological: Negative for weakness and numbness.     Physical Exam Updated Vital Signs BP 140/86 (BP Location: Right Arm)   Pulse 62   Temp 98 F (36.7 C) (Oral)   Resp 20   SpO2 98%   Physical Exam  Constitutional: He appears well-developed and well-nourished. No distress.  Sitting comfortably in bed.  HENT:  Head: Normocephalic and atraumatic.  Eyes: Conjunctivae are normal. Right eye exhibits no discharge. Left eye exhibits no discharge.  EOMs normal to gross examination.  Neck: Normal range of motion.  Cardiovascular: Normal rate and regular rhythm.  Intact, 2+ radial pulse bilaterally.  Pulmonary/Chest: Effort normal and breath sounds normal. He has no wheezes. He has no rales.  Normal respiratory effort. Patient converses comfortably. No audible wheeze or stridor.  Abdominal: He exhibits no distension.  Musculoskeletal: Normal range of motion.  Bilateral upper extremities  with soft compartments.  No resistance to passive range of motion of bilateral arms. Right arm exam: The right arm antecubital fossa has an elevated vein, appearing scarred.  Patient reports that it is always like this, and did not occur in the last 2 weeks.  There is a well-healing hematoma of the lateral right antecubital fossa. Left arm exam: Left arm antecubital fossa exhibits a well-healing hematoma just distal to the antecubital fossa.   Neurological: He is alert.  Cranial nerves intact to gross observation. Patient moves  extremities without difficulty. Normal and symmetric gait.  Skin: Skin is warm and dry. He is not diaphoretic.  Psychiatric: He has a normal mood and affect. His behavior is normal. Judgment and thought content normal.  Nursing note and vitals reviewed.    ED Treatments / Results  Labs (all labs ordered are listed, but only abnormal results are displayed) Labs Reviewed - No data to display  EKG None  Radiology No results found.  Procedures Procedures (including critical care time)  Medications Ordered in ED Medications - No data to display   Initial Impression / Assessment and Plan / ED Course  I have reviewed the triage vital signs and the nursing notes.  Pertinent labs & imaging results that were available during my care of the patient were reviewed by me and considered in my medical decision making (see chart for details).     Patient is nontoxic-appearing and in no acute distress.  Examination is consistent with infiltration of IVs, that are appearing to be well-healing.  There is no evidence of swelling consistent with DVT nor pain consistent with DVT of upper extremities.  No findings suggestive of superficial venous thrombosis.  History and examination are not consistent with rhabdomyolysis.  There is no erythema suggestive of cellulitis.  Suspect well-healing hematomas.  Patient given naproxen twice daily for a week, and instructed to apply cool compresses over the bruising.  Patient instructed to avoid plasma donation for the next month.  Patient is understanding and agrees with the plan of care.  Final Clinical Impressions(s) / ED Diagnoses   Final diagnoses:  Bilateral arm pain  Contusion of upper extremity, unspecified laterality, initial encounter    ED Discharge Orders        Ordered    naproxen (NAPROSYN) 500 MG tablet  2 times daily     11/16/17 0932       Albesa Seen, PA-C 11/16/17 1017    Hayden Rasmussen, MD 11/16/17 1826

## 2018-09-10 ENCOUNTER — Encounter (HOSPITAL_COMMUNITY): Payer: Self-pay

## 2018-09-10 ENCOUNTER — Other Ambulatory Visit: Payer: Self-pay

## 2018-09-10 ENCOUNTER — Emergency Department (HOSPITAL_COMMUNITY)
Admission: EM | Admit: 2018-09-10 | Discharge: 2018-09-10 | Disposition: A | Discharge status: Home / Self Care | Payer: Medicaid Other | Attending: Emergency Medicine | Admitting: Emergency Medicine

## 2018-09-10 DIAGNOSIS — I10 Essential (primary) hypertension: Secondary | ICD-10-CM | POA: Insufficient documentation

## 2018-09-10 DIAGNOSIS — N4830 Priapism, unspecified: Secondary | ICD-10-CM | POA: Insufficient documentation

## 2018-09-10 DIAGNOSIS — F17210 Nicotine dependence, cigarettes, uncomplicated: Secondary | ICD-10-CM | POA: Insufficient documentation

## 2018-09-10 HISTORY — DX: Essential (primary) hypertension: I10

## 2018-09-10 MED ORDER — METRONIDAZOLE 500 MG PO TABS
2000.0000 mg | ORAL_TABLET | Freq: Once | ORAL | Status: AC
  Administered 2018-09-10: 2000 mg via ORAL
  Filled 2018-09-10: qty 4

## 2018-09-10 MED ORDER — AZITHROMYCIN 1 G PO PACK
1.0000 g | PACK | Freq: Once | ORAL | Status: AC
  Administered 2018-09-10: 12:00:00 1 g via ORAL
  Filled 2018-09-10: qty 1

## 2018-09-10 MED ORDER — CEFTRIAXONE SODIUM 250 MG IJ SOLR
250.0000 mg | Freq: Once | INTRAMUSCULAR | Status: AC
  Administered 2018-09-10: 12:00:00 250 mg via INTRAMUSCULAR
  Filled 2018-09-10: qty 250

## 2018-09-10 NOTE — ED Triage Notes (Addendum)
Patient dropped of by sister.   C/O erection x4 hours.    Patient took an "E" pill 2 days ago.   Patient states before he took the "E" pill he has has had long erection in the past lasting an hour. This time it is more painful.   Patient also wants to be tested for STDs.   Ambulatory in triage.  A/Ox4

## 2018-09-10 NOTE — ED Notes (Signed)
This writer attempted to collect labs on patient. Patient was stuck twice by RN and stated he did not want to be stuck again.

## 2018-09-10 NOTE — ED Provider Notes (Signed)
[name] Provider Note   CSN: [id number] Arrival date & time: 09/10/18  1039    History   Chief Complaint Chief Complaint  Patient presents with  . priapism    HPI Max Leach is a 38 y.o. male.     38 yo M with a cc of priapism.  Going on for the past 4 hours.  No trauma.  Concern for STD as well.  New partners mild dysuria.  Has been taking ecstasy fairly regularly and marijuana he denies any erectile dysfunction drugs.  He has had this happen a few times after taking the ecstasy usually goes down after about an hour.  He feels that it is more painful than it typically is.  He tried laying down and going about his normal day without improvement.  The history is provided by the patient.  Illness  Associated symptoms: no abdominal pain, no chest pain, no congestion, no diarrhea, no fever, no headaches, no myalgias, no rash, no shortness of breath and no vomiting   Erectile Dysfunction  This is a new problem. The current episode started 3 to 5 hours ago. The problem occurs constantly. The problem has been gradually worsening. Pertinent negatives include no chest pain, no abdominal pain, no headaches and no shortness of breath. Nothing aggravates the symptoms. Nothing relieves the symptoms. He has tried nothing for the symptoms. The treatment provided no relief.    Past Medical History:  Diagnosis Date  . Arthritis    r knee  . Hypertension     Patient Active Problem List   Diagnosis Date Noted  . Tendinitis 11/21/2015    Past Surgical History:  Procedure Laterality Date  . KNEE ARTHROPLASTY Right         Home Medications    Prior to Admission medications   Medication Sig Start Date End Date Taking? Authorizing Provider  ibuprofen (ADVIL,MOTRIN) 800 MG tablet Take 1 tablet (800 mg total) by mouth 3 (three) times daily. Patient not taking: Reported on 09/10/2018 07/03/17   Augusto Gamble B, NP  naproxen (NAPROSYN) 500 MG  tablet Take 1 tablet (500 mg total) by mouth 2 (two) times daily. Patient not taking: Reported on 09/10/2018 11/16/17   Albesa Seen, PA-C    Family History Family History  Problem Relation Age of Onset  . Arthritis Mother     Social History Social History   Tobacco Use  . Smoking status: Current Every Day Smoker    Packs/day: 0.00  . Smokeless tobacco: Current User  Substance Use Topics  . Alcohol use: Yes  . Drug use: Yes    Types: Marijuana     Allergies   Shrimp [shellfish allergy]   Review of Systems Review of Systems  Constitutional: Negative for chills and fever.  HENT: Negative for congestion and facial swelling.   Eyes: Negative for discharge and visual disturbance.  Respiratory: Negative for shortness of breath.   Cardiovascular: Negative for chest pain and palpitations.  Gastrointestinal: Negative for abdominal pain, diarrhea and vomiting.  Genitourinary: Positive for penile pain and penile swelling.  Musculoskeletal: Negative for arthralgias and myalgias.  Skin: Negative for color change and rash.  Neurological: Negative for tremors, syncope and headaches.  Psychiatric/Behavioral: Negative for confusion and dysphoric mood.     Physical Exam Updated Vital Signs BP (!) 184/122   Pulse (!) 59   Temp 98.2 F (36.8 C) (Oral)   Resp 18   SpO2 100%   Physical Exam Vitals signs and  nursing note reviewed.  Constitutional:      Appearance: He is well-developed.  HENT:     Head: Normocephalic and atraumatic.  Eyes:     Pupils: Pupils are equal, round, and reactive to light.  Neck:     Musculoskeletal: Normal range of motion and neck supple.     Vascular: No JVD.  Cardiovascular:     Rate and Rhythm: Normal rate and regular rhythm.     Heart sounds: No murmur. No friction rub. No gallop.   Pulmonary:     Effort: No respiratory distress.     Breath sounds: No wheezing.  Abdominal:     General: There is no distension.     Tenderness: There is no  guarding or rebound.  Genitourinary:    Comments: Erect penis, no noted rash, no discharge. circumsized Musculoskeletal: Normal range of motion.  Skin:    Coloration: Skin is not pale.     Findings: No rash.  Neurological:     Mental Status: He is alert and oriented to person, place, and time.  Psychiatric:        Behavior: Behavior normal.      ED Treatments / Results  Labs (all labs ordered are listed, but only abnormal results are displayed) Labs Reviewed  HIV ANTIBODY (ROUTINE TESTING W REFLEX)  RPR  GC/CHLAMYDIA PROBE AMP ([place]) NOT AT [facility]    EKG None  Radiology No results found.  Procedures Procedures (including critical care time)  Medications Ordered in ED Medications  cefTRIAXone (ROCEPHIN) injection 250 mg (250 mg Intramuscular Given 09/10/18 1144)  azithromycin (ZITHROMAX) powder 1 g (1 g Oral Given 09/10/18 1145)  metroNIDAZOLE (FLAGYL) tablet 2,000 mg (2,000 mg Oral Given 09/10/18 1145)     Initial Impression / Assessment and Plan / ED Course  I have reviewed the triage vital signs and the nursing notes.  Pertinent labs & imaging results that were available during my care of the patient were reviewed by me and considered in my medical decision making (see chart for details).        38 yo M with a chief complaint of priapism.  Going on for about 4 hours now.  Likely related to illegal drug use.  I offered needle decompression or phenylephrine administration.  He is declining at this time.  He also would like to be screened for STDs.  Will test and treat.  Priapism resolved spontaneously.  D/c home.   12:09 PM:  I have discussed the diagnosis/risks/treatment options with the patient and believe the pt to be eligible for discharge home to follow-up with Urology. We also discussed returning to the ED immediately if new or worsening sx occur. We discussed the sx which are most concerning (e.g., sudden worsening pain, fever, inability to tolerate by  mouth) that necessitate immediate return. Medications administered to the patient during their visit and any new prescriptions provided to the patient are listed below.  Medications given during this visit Medications  cefTRIAXone (ROCEPHIN) injection 250 mg (250 mg Intramuscular Given 09/10/18 1144)  azithromycin (ZITHROMAX) powder 1 g (1 g Oral Given 09/10/18 1145)  metroNIDAZOLE (FLAGYL) tablet 2,000 mg (2,000 mg Oral Given 09/10/18 1145)     The patient appears reasonably screen and/or stabilized for discharge and I doubt any other medical condition or other [facility] requiring further screening, evaluation, or treatment in the ED at this time prior to discharge.     Final Clinical Impressions(s) / ED Diagnoses   Final diagnoses:  Priapism  ED Discharge Orders    None       Deno Etienne, [place] 09/10/18 1209

## 2018-09-10 NOTE — Discharge Instructions (Signed)
Follow up with urology if you like.  Return for worsening issues.

## 2018-09-11 ENCOUNTER — Other Ambulatory Visit: Payer: Self-pay

## 2018-09-11 ENCOUNTER — Encounter (HOSPITAL_COMMUNITY): Payer: Self-pay | Admitting: Emergency Medicine

## 2018-09-11 ENCOUNTER — Emergency Department (HOSPITAL_COMMUNITY)
Admission: EM | Admit: 2018-09-11 | Discharge: 2018-09-11 | Disposition: A | Discharge status: Home / Self Care | Payer: Medicaid Other | Attending: Emergency Medicine | Admitting: Emergency Medicine

## 2018-09-11 DIAGNOSIS — F17220 Nicotine dependence, chewing tobacco, uncomplicated: Secondary | ICD-10-CM | POA: Diagnosis not present

## 2018-09-11 DIAGNOSIS — I10 Essential (primary) hypertension: Secondary | ICD-10-CM | POA: Diagnosis not present

## 2018-09-11 DIAGNOSIS — N4830 Priapism, unspecified: Secondary | ICD-10-CM | POA: Insufficient documentation

## 2018-09-11 DIAGNOSIS — F17200 Nicotine dependence, unspecified, uncomplicated: Secondary | ICD-10-CM | POA: Insufficient documentation

## 2018-09-11 LAB — GC/CHLAMYDIA PROBE AMP (~~LOC~~) NOT AT ARMC
Chlamydia: NEGATIVE
Neisseria Gonorrhea: NEGATIVE

## 2018-09-11 MED ORDER — HYDROCODONE-ACETAMINOPHEN 5-325 MG PO TABS: 1.0000 | ORAL_TABLET | Freq: Four times a day (QID) | ORAL | 0 refills | Status: DC | PRN

## 2018-09-11 MED ORDER — HYDROMORPHONE HCL 2 MG/ML IJ SOLN
2.0000 mg | Freq: Once | INTRAMUSCULAR | Status: AC
  Administered 2018-09-11: 05:00:00 2 mg via INTRAMUSCULAR
  Filled 2018-09-11: qty 1

## 2018-09-11 MED ORDER — PHENYLEPHRINE 200 MCG/ML FOR PRIAPISM / HYPOTENSION
50.0000 ug | Freq: Once | INTRAMUSCULAR | Status: AC
  Administered 2018-09-11: 50 ug via INTRACAVERNOUS
  Filled 2018-09-11: qty 50

## 2018-09-11 NOTE — ED Triage Notes (Signed)
Pt c/o prolonged painful erection that has last x2 days this episode seen at [facility] 1 day ago for same.  Also states having episodes of same over the last few months.

## 2018-09-11 NOTE — ED Provider Notes (Signed)
[facility] DEPT Provider Note   CSN: [id number] Arrival date & time: 09/11/18  0406    History   Chief Complaint Chief Complaint  Patient presents with  . painful prolonged erection    HPI Max Leach is a 38 y.o. male.     Patient is a 38 year old male with past medical history of hypertension and arthritis.  He presents today with complaints of a painful persistent erection for the past 2 days.  He was seen here yesterday, but was discharged after erection seem to resolve.  He was treated for STDs.  Patient does admit to using ecstasy in the past 48 hours.  He has never had this problem before.  The history is provided by the patient.    Past Medical History:  Diagnosis Date  . Arthritis    r knee  . Hypertension     Patient Active Problem List   Diagnosis Date Noted  . Tendinitis 11/21/2015    Past Surgical History:  Procedure Laterality Date  . KNEE ARTHROPLASTY Right         Home Medications    Prior to Admission medications   Medication Sig Start Date End Date Taking? Authorizing Provider  ibuprofen (ADVIL,MOTRIN) 800 MG tablet Take 1 tablet (800 mg total) by mouth 3 (three) times daily. Patient not taking: Reported on 09/10/2018 07/03/17   Augusto Gamble B, NP  naproxen (NAPROSYN) 500 MG tablet Take 1 tablet (500 mg total) by mouth 2 (two) times daily. Patient not taking: Reported on 09/10/2018 11/16/17   Albesa Seen, PA-C    Family History Family History  Problem Relation Age of Onset  . Arthritis Mother     Social History Social History   Tobacco Use  . Smoking status: Current Every Day Smoker    Packs/day: 0.00  . Smokeless tobacco: Current User  Substance Use Topics  . Alcohol use: Yes  . Drug use: Yes    Types: Marijuana     Allergies   Shrimp [shellfish allergy]   Review of Systems Review of Systems  All other systems reviewed and are negative.    Physical Exam Updated Vital Signs BP  (!) 198/95 (BP Location: Left Arm)   Pulse 62   Temp 97.9 F (36.6 C) (Oral)   Resp 18   Ht 6\' 2"  (1.88 m)   Wt 117.9 kg   SpO2 100%   BMI 33.38 kg/m   Physical Exam Vitals signs and nursing note reviewed.  Constitutional:      General: He is not in acute distress.    Appearance: Normal appearance. He is not ill-appearing.  HENT:     Head: Normocephalic.  Pulmonary:     Effort: Pulmonary effort is normal.  Genitourinary:    Comments: Penis is erect and tender to palpation.  There are no lesions.  Remainder of external genitalia is unremarkable in appearance. Neurological:     Mental Status: He is alert.      ED Treatments / Results  Labs (all labs ordered are listed, but only abnormal results are displayed) Labs Reviewed - No data to display  EKG None  Radiology No results found.  Procedures Procedures (including critical care time)  Medications Ordered in ED Medications  phenylephrine 200 mcg / ml CONC. DILUTION INJ (ED / Urology USE ONLY) (50 mcg Intracavernosal Given 09/11/18 0522)  HYDROmorphone (DILAUDID) injection 2 mg (2 mg Intramuscular Given 09/11/18 0512)     Initial Impression / Assessment and Plan /  ED Course  I have reviewed the triage vital signs and the nursing notes.  Pertinent labs & imaging results that were available during my care of the patient were reviewed by me and considered in my medical decision making (see chart for details).  Patient presents here with priapism, likely related to use of ecstasy 2 days ago.  Patient was injected with phenylephrine after aspiration of 3 cc of blood from either corpora.  He tolerated this procedure reasonably well.  Patient is feeling significantly improved.  A significant degree of detumescence has been obtained.  This was discussed with Dr. Alyson Ingles from urology.  He feels comfortable with the patient following up in the office as an outpatient.  Patient understands to return if his symptoms recur.   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    Final Clinical Impressions(s) / ED Diagnoses   Final diagnoses:  None    ED Discharge Orders    None       Veryl Speak, MD 09/11/18 [phone]

## 2018-09-11 NOTE — ED Notes (Signed)
246mcg/1ml Phenylephrine injection given

## 2018-09-11 NOTE — ED Notes (Signed)
Discharge instructions and medications reviewed with patient. Patient verbalizes understanding and has no questions at this time.

## 2018-09-11 NOTE — Discharge Instructions (Signed)
Hydrocodone as prescribed as needed for pain.  Return to the ER if your symptoms worsen or change.  Follow-up in the urology clinic Monday or Tuesday.  Dr. Alyson Ingles is aware of your situation and wants you seen then.  The number for the [facility] has been provided in this discharge summary for you to call this afternoon and make these arrangements.

## 2018-09-11 NOTE — ED Notes (Signed)
267mcg/1ml Phenylephrine injection given

## 2018-11-28 ENCOUNTER — Other Ambulatory Visit: Payer: Self-pay

## 2018-11-28 ENCOUNTER — Encounter (HOSPITAL_COMMUNITY): Payer: Self-pay | Admitting: Emergency Medicine

## 2018-11-28 ENCOUNTER — Ambulatory Visit (HOSPITAL_COMMUNITY)
Admission: EM | Admit: 2018-11-28 | Discharge: 2018-11-28 | Disposition: A | Discharge status: Home / Self Care | Payer: Medicaid Other | Attending: Family Medicine | Admitting: Family Medicine

## 2018-11-28 DIAGNOSIS — L089 Local infection of the skin and subcutaneous tissue, unspecified: Secondary | ICD-10-CM

## 2018-11-28 DIAGNOSIS — S61239A Puncture wound without foreign body of unspecified finger without damage to nail, initial encounter: Secondary | ICD-10-CM

## 2018-11-28 MED ORDER — DOXYCYCLINE HYCLATE 100 MG PO CAPS: 100.0000 mg | ORAL_CAPSULE | Freq: Two times a day (BID) | ORAL | 0 refills | Status: DC

## 2018-11-28 NOTE — ED Triage Notes (Addendum)
Injured left index finger when cleaning a frying pan last Sunday.  Visible puncture wound  Patient has had swelling, stiffness.  Swelling was worse yesterday

## 2018-11-28 NOTE — ED Provider Notes (Signed)
[facility]   [id number] 11/28/18 Arrival Time: 1025  ASSESSMENT & PLAN:  1. Infected puncture wound of finger, initial encounter     No sign of a felon.  To begin: Meds ordered this encounter  Medications  . doxycycline (VIBRAMYCIN) 100 MG capsule    Sig: Take 1 capsule (100 mg total) by mouth 2 (two) times daily.    Dispense:  20 capsule    Refill:  0   Close observation. Recommend: Follow-up Information    Roseanne Kaufman, MD.   Specialty: Orthopedic Surgery Why: If symptoms worsen in any way. Contact information: [address] [address] [phone]            Reviewed expectations re: course of current medical issues. Questions answered. Outlined signs and symptoms indicating need for more acute intervention. Patient verbalized understanding. After Visit Summary given.  SUBJECTIVE: History from: patient. Max Leach is a 38 y.o. male who reports washing a dish approx one week ago. "Something poked my finger." Left second finger. Minimal bleeding. Reports gradual swelling; now somewhat painful. No home treatment. Normal ROM described. No extremity sensation changes or weakness. Afebrile. No specific aggravating or alleviating factors reported.   Past Surgical History:  Procedure Laterality Date  . KNEE ARTHROPLASTY Right      ROS: As per HPI.   OBJECTIVE:  Vitals:   11/28/18 1452  BP: 140/80  Pulse: 69  Resp: 18  Temp: 98 F (36.7 C)  TempSrc: Oral  SpO2: 96%    General appearance: alert; no distress HEENT: [place]; AT Neck: supple with FROM Resp: unlabored respirations Extremities: . LUE: warm and well perfused; fairly well localized moderate tenderness over left ulnar side of distal second finger; slight erythema; no swelling/discomfort over pad of finger; nail intact and normal; without gross deformities; with no bruising; ROM: normal without reported discomfort CV: brisk extremity capillary refill of  LUE; 2+ radial pulse of LUE. Skin: warm and dry; no visible rashes Neurologic: normal strength of LUE Psychological: alert and cooperative; normal mood and affect   Allergies  Allergen Reactions  . Shrimp [Shellfish Allergy] Swelling    crab    Past Medical History:  Diagnosis Date  . Arthritis    r knee  . Hypertension    Social History   Socioeconomic History  . Marital status: Single    Spouse name: Not on file  . Number of children: Not on file  . Years of education: Not on file  . Highest education level: Not on file  Occupational History  . Not on file  Social Needs  . Financial resource strain: Not on file  . Food insecurity    Worry: Not on file    Inability: Not on file  . Transportation needs    Medical: Not on file    Non-medical: Not on file  Tobacco Use  . Smoking status: Current Every Day Smoker    Packs/day: 0.00  . Smokeless tobacco: Current User  Substance and Sexual Activity  . Alcohol use: Yes  . Drug use: Yes    Types: Marijuana  . Sexual activity: Not on file  Lifestyle  . Physical activity    Days per week: Not on file    Minutes per session: Not on file  . Stress: Not on file  Relationships  . Social Herbalist on phone: Not on file    Gets together: Not on file    Attends religious service: Not  on file    Active member of club or organization: Not on file    Attends meetings of clubs or organizations: Not on file    Relationship status: Not on file  Other Topics Concern  . Not on file  Social History Narrative  . Not on file   Family History  Problem Relation Age of Onset  . Arthritis Mother    Past Surgical History:  Procedure Laterality Date  . KNEE ARTHROPLASTY Right       Vanessa Kick, MD 11/28/18 1515

## 2019-11-25 ENCOUNTER — Encounter (HOSPITAL_COMMUNITY): Payer: Self-pay

## 2019-11-25 ENCOUNTER — Other Ambulatory Visit: Payer: Self-pay

## 2019-11-25 ENCOUNTER — Ambulatory Visit (HOSPITAL_COMMUNITY)
Admission: EM | Admit: 2019-11-25 | Discharge: 2019-11-25 | Disposition: A | Discharge status: Home / Self Care | Payer: Medicaid Other | Attending: Family Medicine | Admitting: Family Medicine

## 2019-11-25 DIAGNOSIS — M25511 Pain in right shoulder: Secondary | ICD-10-CM

## 2019-11-25 DIAGNOSIS — R600 Localized edema: Secondary | ICD-10-CM

## 2019-11-25 MED ORDER — TRAMADOL-ACETAMINOPHEN 37.5-325 MG PO TABS: 2.0000 | ORAL_TABLET | Freq: Four times a day (QID) | ORAL | 0 refills | Status: DC | PRN

## 2019-11-25 MED ORDER — METHYLPREDNISOLONE 4 MG PO TBPK: ORAL_TABLET | ORAL | 0 refills | Status: DC

## 2019-11-25 MED ORDER — FUROSEMIDE 20 MG PO TABS
20.0000 mg | ORAL_TABLET | Freq: Every day | ORAL | 0 refills | Status: DC
Start: 2019-11-25 — End: 2021-07-09

## 2019-11-25 NOTE — Discharge Instructions (Signed)
Watch the salt in your diet Take the furosemide once a day when you have swelling in your ankles Elevate your feet  Take the Medrol pack for your shoulder pain Take all of day 1 today take Ultracet if you needed for pain in addition to the steroid

## 2019-11-25 NOTE — ED Triage Notes (Signed)
R shoulder pain x 1 week after working out

## 2019-11-25 NOTE — ED Provider Notes (Signed)
Max Leach    CSN: [id number] Arrival date & time: 11/25/19  4287      History   Chief Complaint Chief Complaint  Patient presents with  . Shoulder Pain    HPI Max Leach is a 39 y.o. male.   HPI  Patient states that he is here for pain in his right shoulder.  Is been bothering him for about a week.  He states he was working out with dumbbells a day before, did not think he overdid it.  Ever since then his right shoulder hurts.  Pain with motion.  Keeps him awake at night.  No injury or overwork. He also complains of pedal edema.  He states he has had this off and on.  His doctor has given him Lasix.  He does not have any Lasix right now.  No shortness of breath.  Denies heart disease.  Past Medical History:  Diagnosis Date  . Arthritis    r knee  . Hypertension     Patient Active Problem List   Diagnosis Date Noted  . Tendinitis 11/21/2015    Past Surgical History:  Procedure Laterality Date  . KNEE ARTHROPLASTY Right        Home Medications    Prior to Admission medications   Medication Sig Start Date End Date Taking? Authorizing Provider  furosemide (LASIX) 20 MG tablet Take 1 tablet (20 mg total) by mouth daily. 11/25/19   Raylene Everts, MD  methylPREDNISolone (MEDROL DOSEPAK) 4 MG TBPK tablet tad 11/25/19   Raylene Everts, MD  traMADol-acetaminophen (ULTRACET) 37.5-325 MG tablet Take 2 tablets by mouth every 6 (six) hours as needed. 11/25/19   Raylene Everts, MD    Family History Family History  Problem Relation Age of Onset  . Arthritis Mother     Social History Social History   Tobacco Use  . Smoking status: Current Every Day Smoker    Packs/day: 0.00  . Smokeless tobacco: Current User  Substance Use Topics  . Alcohol use: Yes  . Drug use: Yes    Types: Marijuana     Allergies   Shrimp [shellfish allergy]   Review of Systems Review of Systems  See HPI Physical Exam Triage Vital Signs ED Triage Vitals    Enc Vitals Group     BP 11/25/19 0843 (!) 146/98     Pulse Rate 11/25/19 0843 64     Resp 11/25/19 0843 16     Temp 11/25/19 0843 97.8 F (36.6 C)     Temp src --      SpO2 11/25/19 0843 100 %     Weight --      Height --      Head Circumference --      Peak Flow --      Pain Score 11/25/19 0841 10     Pain Loc --      Pain Edu? --      Excl. in [place]? --    No data found.  Updated Vital Signs BP (!) 146/98   Pulse 64   Temp 97.8 F (36.6 C)   Resp 16   SpO2 100%      Physical Exam Constitutional:      General: He is not in acute distress.    Appearance: He is well-developed. He is obese.  HENT:     Head: Normocephalic and atraumatic.     Mouth/Throat:     Comments: Mask is in place Eyes:  Conjunctiva/sclera: Conjunctivae normal.     Pupils: Pupils are equal, round, and reactive to light.  Cardiovascular:     Rate and Rhythm: Normal rate.  Pulmonary:     Effort: Pulmonary effort is normal. No respiratory distress.  Abdominal:     General: There is no distension.     Palpations: Abdomen is soft.  Musculoskeletal:        General: Normal range of motion.     Cervical back: Normal range of motion.     Right lower leg: Edema present.     Left lower leg: Edema present.     Comments: Patient has pain with abduction above 90 degrees, pain with internal and external rotation.  Tenderness diffusely around the shoulder joint.  Strength is intact.  Empty can is positive  Skin:    General: Skin is warm and dry.  Neurological:     Mental Status: He is alert.  Psychiatric:        Mood and Affect: Mood normal.        Behavior: Behavior normal.      UC Treatments / Results  Labs (all labs ordered are listed, but only abnormal results are displayed) Labs Reviewed - No data to display  EKG   Radiology No results found.  Procedures Procedures (including critical care time)  Medications Ordered in UC Medications - No data to display  Initial Impression /  Assessment and Plan / UC Course  I have reviewed the triage vital signs and the nursing notes.  Pertinent labs & imaging results that were available during my care of the patient were reviewed by me and considered in my medical decision making (see chart for details).     Patient has symptoms of rotator cuff tendinopathy.  Doubt tear.  Strength is intact.  We will treat conservatively with prednisone.  Range of motion exercises were demonstrated to him. Patient has edema which is a chronic problem.  Here for medicine refill Final Clinical Impressions(s) / UC Diagnoses   Final diagnoses:  Acute pain of right shoulder  Pedal edema     Discharge Instructions     Watch the salt in your diet Take the furosemide once a day when you have swelling in your ankles Elevate your feet  Take the Medrol pack for your shoulder pain Take all of day 1 today take Ultracet if you needed for pain in addition to the steroid    ED Prescriptions    Medication Sig Dispense Auth. Provider   methylPREDNISolone (MEDROL DOSEPAK) 4 MG TBPK tablet tad 21 tablet Raylene Everts, MD   traMADol-acetaminophen (ULTRACET) 37.5-325 MG tablet Take 2 tablets by mouth every 6 (six) hours as needed. 20 tablet Raylene Everts, MD   furosemide (LASIX) 20 MG tablet Take 1 tablet (20 mg total) by mouth daily. 30 tablet Raylene Everts, MD     I have reviewed the PDMP during this encounter.   Raylene Everts, MD 11/25/19 [phone]

## 2020-08-24 ENCOUNTER — Other Ambulatory Visit: Payer: Self-pay

## 2020-08-24 ENCOUNTER — Encounter (HOSPITAL_COMMUNITY): Payer: Self-pay

## 2020-08-24 ENCOUNTER — Ambulatory Visit (HOSPITAL_COMMUNITY)
Admission: EM | Admit: 2020-08-24 | Discharge: 2020-08-24 | Disposition: A | Discharge status: Home / Self Care | Payer: Medicaid Other | Attending: Family Medicine | Admitting: Family Medicine

## 2020-08-24 DIAGNOSIS — H1131 Conjunctival hemorrhage, right eye: Secondary | ICD-10-CM | POA: Diagnosis not present

## 2020-08-24 DIAGNOSIS — H00011 Hordeolum externum right upper eyelid: Secondary | ICD-10-CM

## 2020-08-24 MED ORDER — TOBRAMYCIN 0.3 % OP SOLN
1.0000 [drp] | Freq: Four times a day (QID) | OPHTHALMIC | 0 refills | Status: DC
Start: 2020-08-24 — End: 2021-07-09

## 2020-08-24 NOTE — ED Provider Notes (Signed)
  [name]   [id number] 08/24/20 Arrival Time: 4944  ASSESSMENT & PLAN:  1. Hordeolum externum of right upper eyelid   2. Subconjunctival hemorrhage of right eye    See AVS for written discharge/diagnosis information given.  Meds ordered this encounter  Medications  . tobramycin (TOBREX) 0.3 % ophthalmic solution    Sig: Place 1 drop into the right eye every 6 (six) hours.    Dispense:  5 mL    Refill:  0   Ophthalmic drops per orders. Warm compress to eye(s). Local eye care discussed.  Reviewed expectations re: course of current medical issues. Questions answered. Outlined signs and symptoms indicating need for more acute intervention. Patient verbalized understanding. After Visit Summary given.   SUBJECTIVE:  Max Leach is a 40 y.o. male who presents with complaint of "a stye"; R lateral lower eyelid; several days. Today noted "blood red in my eye"; R. No associated eye pain or photophobia. No injury. Visual changes: no. Contact lens use: no. Recent illness: no. Self treatment: OTC "stye drop" without much relief.   OBJECTIVE:  Vitals:   08/24/20 1328  BP: 136/81  Pulse: 65  Resp: 20  Temp: 98.3 F (36.8 C)  TempSrc: Oral  SpO2: 100%    General appearance: alert; no distress HEENT: [place]; AT; PERRLA; no restriction of or pain with extraocular movements OS: normal OD: lateral subconjunctival hemorrhage; without drainage; without corneal opacities; without limbal flush; without periorbital swelling or erythema; lower lateral lid with stye Neck: supple without LAD Lungs: clear to auscultation bilaterally; unlabored respirations Heart: regular rate and rhythm Skin: warm and dry Psychological: alert and cooperative; normal mood and affect   Allergies  Allergen Reactions  . Shrimp [Shellfish Allergy] Swelling    crab    Past Medical History:  Diagnosis Date  . Arthritis    r knee  . Hypertension    Social History   Socioeconomic  History  . Marital status: Single    Spouse name: Not on file  . Number of children: Not on file  . Years of education: Not on file  . Highest education level: Not on file  Occupational History  . Not on file  Tobacco Use  . Smoking status: Current Every Day Smoker    Packs/day: 0.00  . Smokeless tobacco: Current User  Substance and Sexual Activity  . Alcohol use: Yes  . Drug use: Yes    Types: Marijuana  . Sexual activity: Not on file  Other Topics Concern  . Not on file  Social History Narrative  . Not on file   Social Determinants of Health   Financial Resource Strain: Not on file  Food Insecurity: Not on file  Transportation Needs: Not on file  Physical Activity: Not on file  Stress: Not on file  Social Connections: Not on file  Intimate Partner Violence: Not on file   Family History  Problem Relation Age of Onset  . Arthritis Mother    Past Surgical History:  Procedure Laterality Date  . KNEE ARTHROPLASTY Right      Vanessa Kick, MD 08/24/20 1407

## 2020-08-24 NOTE — ED Triage Notes (Signed)
Pt presents with eye redness x this morning. Pt states he had a stye since Monday.

## 2021-07-09 ENCOUNTER — Other Ambulatory Visit: Payer: Self-pay

## 2021-07-09 ENCOUNTER — Ambulatory Visit (HOSPITAL_COMMUNITY)
Admission: EM | Admit: 2021-07-09 | Discharge: 2021-07-09 | Disposition: A | Discharge status: Home / Self Care | Payer: Medicaid Other | Attending: Emergency Medicine | Admitting: Emergency Medicine

## 2021-07-09 ENCOUNTER — Encounter (HOSPITAL_COMMUNITY): Payer: Self-pay

## 2021-07-09 ENCOUNTER — Ambulatory Visit (INDEPENDENT_AMBULATORY_CARE_PROVIDER_SITE_OTHER): Payer: Medicaid Other

## 2021-07-09 DIAGNOSIS — J309 Allergic rhinitis, unspecified: Secondary | ICD-10-CM | POA: Diagnosis not present

## 2021-07-09 DIAGNOSIS — R040 Epistaxis: Secondary | ICD-10-CM | POA: Diagnosis not present

## 2021-07-09 DIAGNOSIS — R042 Hemoptysis: Secondary | ICD-10-CM | POA: Diagnosis not present

## 2021-07-09 MED ORDER — FLUTICASONE PROPIONATE 50 MCG/ACT NA SUSP: 2.0000 | Freq: Every day | NASAL | 0 refills | Status: AC

## 2021-07-09 MED ORDER — CETIRIZINE HCL 10 MG PO TABS: 10.0000 mg | ORAL_TABLET | Freq: Every day | ORAL | 2 refills | Status: AC

## 2021-07-09 NOTE — ED Triage Notes (Signed)
Patient comes in today with a runny nose that has been going on for the past three days. Patient also states he has been coughing up blood for the past two days.  ? ?Patient states shortness of breath throughout the day. No distress noted. ?

## 2021-07-09 NOTE — ED Provider Notes (Signed)
[facility]    CSN: [id number] Arrival date & time: 07/09/21  1630    HISTORY   Chief Complaint  Patient presents with   Epistaxis   HPI Max Leach is a 41 y.o. male. Patient comes in today with a bloody, runny nose that has been going on for the past three days. Patient also states he has been coughing up small clots of bright red blood for the past two days.  Patient states he has been staying at the local shelter and that the temperature is very cold inside the building.  Patient states his cough is nonproductive otherwise.  Patient states he is also been feeling a little bit short of breath, states he is here to get "checked out".  Patient denies fever, aches, chills, nausea, vomiting, diarrhea.  Patient endorses multiple sick contacts at the shelter.  Denies history of tuberculosis.  Patient states he is a current everyday smoker.  The history is provided by the patient.  Past Medical History:  Diagnosis Date   Arthritis    r knee   Hypertension    Patient Active Problem List   Diagnosis Date Noted   Tendinitis 11/21/2015   Past Surgical History:  Procedure Laterality Date   KNEE ARTHROPLASTY Right     Home Medications    Prior to Admission medications   Not on File   Family History Family History  Problem Relation Age of Onset   Arthritis Mother    Social History Social History   Tobacco Use   Smoking status: Every Day    Packs/day: 0.00    Types: Cigarettes   Smokeless tobacco: Current  Substance Use Topics   Alcohol use: Yes   Drug use: Yes    Types: Marijuana   Allergies   Shrimp [shellfish allergy]  Review of Systems Review of Systems Pertinent findings noted in history of present illness.   Physical Exam Triage Vital Signs ED Triage Vitals  Enc Vitals Group     BP 02/23/21 0827 (!) 147/82     Pulse Rate 02/23/21 0827 72     Resp 02/23/21 0827 18     Temp 02/23/21 0827 98.3 F (36.8 C)     Temp Source 02/23/21 0827  Oral     SpO2 02/23/21 0827 98 %     Weight --      Height --      Head Circumference --      Peak Flow --      Pain Score 02/23/21 0826 5     Pain Loc --      Pain Edu? --      Excl. in [place]? --   No data found.  Updated Vital Signs BP 137/82 (BP Location: Left Arm)    Pulse 64    Temp 98 F (36.7 C) (Oral)    Resp 20    SpO2 100%   Physical Exam Vitals and nursing note reviewed.  Constitutional:      General: He is not in acute distress.    Appearance: Normal appearance. He is not ill-appearing.  HENT:     Head: Normocephalic and atraumatic.     Salivary Glands: Right salivary gland is not diffusely enlarged or tender. Left salivary gland is not diffusely enlarged or tender.     Right Ear: Ear canal and external ear normal. No drainage. A middle ear effusion is present. There is no impacted cerumen. Tympanic membrane is bulging. Tympanic membrane is not injected or erythematous.  Left Ear: Ear canal and external ear normal. No drainage. A middle ear effusion is present. There is no impacted cerumen. Tympanic membrane is bulging. Tympanic membrane is not injected or erythematous.     Ears:     Comments: Bilateral EACs normal, both TMs bulging with clear fluid    Nose: Rhinorrhea present. No nasal deformity, septal deviation, signs of injury, nasal tenderness, mucosal edema or congestion. Rhinorrhea is clear.     Right Nostril: Occlusion present. No foreign body, epistaxis or septal hematoma.     Left Nostril: Occlusion present. No foreign body, epistaxis or septal hematoma.     Right Turbinates: Enlarged, swollen and pale.     Left Turbinates: Enlarged, swollen and pale.     Right Sinus: No maxillary sinus tenderness or frontal sinus tenderness.     Left Sinus: No maxillary sinus tenderness or frontal sinus tenderness.     Mouth/Throat:     Lips: Pink. No lesions.     Mouth: Mucous membranes are moist. No oral lesions.     Pharynx: Oropharynx is clear. Uvula midline. No  posterior oropharyngeal erythema or uvula swelling.     Tonsils: No tonsillar exudate. 0 on the right. 0 on the left.     Comments: Postnasal drip Eyes:     General: Lids are normal.        Right eye: No discharge.        Left eye: No discharge.     Extraocular Movements: Extraocular movements intact.     Conjunctiva/sclera: Conjunctivae normal.     Right eye: Right conjunctiva is not injected.     Left eye: Left conjunctiva is not injected.  Neck:     Trachea: Trachea and phonation normal.  Cardiovascular:     Rate and Rhythm: Normal rate and regular rhythm.     Pulses: Normal pulses.     Heart sounds: Normal heart sounds. No murmur heard.   No friction rub. No gallop.  Pulmonary:     Effort: Pulmonary effort is normal. No accessory muscle usage, prolonged expiration or respiratory distress.     Breath sounds: Normal breath sounds. No stridor, decreased air movement or transmitted upper airway sounds. No decreased breath sounds, wheezing, rhonchi or rales.  Chest:     Chest wall: No tenderness.  Musculoskeletal:        General: Normal range of motion.     Cervical back: Normal range of motion and neck supple. Normal range of motion.  Lymphadenopathy:     Cervical: No cervical adenopathy.  Skin:    General: Skin is warm and dry.     Findings: No erythema or rash.  Neurological:     General: No focal deficit present.     Mental Status: He is alert and oriented to person, place, and time.  Psychiatric:        Mood and Affect: Mood normal.        Behavior: Behavior normal.    Visual Acuity Right Eye Distance:   Left Eye Distance:   Bilateral Distance:    Right Eye Near:   Left Eye Near:    Bilateral Near:     UC Couse / Diagnostics / Procedures:    EKG  Radiology DG Chest 2 View  Result Date: 07/09/2021 CLINICAL DATA:  Hemoptysis EXAM: CHEST - 2 VIEW COMPARISON:  None. FINDINGS: The heart and mediastinal contours are within normal limits. No focal consolidation. No  pulmonary edema. No pleural effusion. No pneumothorax. No acute  osseous abnormality. IMPRESSION: No active cardiopulmonary disease. Electronically Signed   By: Iven Finn M.D.   On: 07/09/2021 18:40    Procedures Procedures (including critical care time)  UC Diagnoses / Final Clinical Impressions(s)   I have reviewed the triage vital signs and the nursing notes.  Pertinent labs & imaging results that were available during my care of the patient were reviewed by me and considered in my medical decision making (see chart for details).   Final diagnoses:  Allergic rhinitis, unspecified seasonality, unspecified trigger  Posterior epistaxis   Chest x-ray is reassuring, patient advised.  Patient is likely suffering from a posterior epistaxis causing him to have bloody postnasal drip and small amounts of old blood in his sputum.  Patient advised to use Afrin or similar product to dry out nosebleed.  Patient also advised to begin Zyrtec and Flonase for allergic rhinitis.  Return precautions advised.  ED Prescriptions     Medication Sig Dispense Auth. Provider   fluticasone (FLONASE) 50 MCG/ACT nasal spray Place 2 sprays into both nostrils daily. 18 mL Lynden Oxford Scales, PA-C   cetirizine (ZYRTEC ALLERGY) 10 MG tablet Take 1 tablet (10 mg total) by mouth at bedtime. 30 tablet Lynden Oxford Scales, PA-C      PDMP not reviewed this encounter.  Pending results:  Labs Reviewed - No data to display  Medications Ordered in UC: Medications - No data to display  Disposition Upon Discharge:  Condition: stable for discharge home Home: take medications as prescribed; routine discharge instructions as discussed; follow up as advised.  Patient presented with an acute illness with associated systemic symptoms and significant discomfort requiring urgent management. In my opinion, this is a condition that a prudent lay person (someone who possesses an average knowledge of health and medicine)  may potentially expect to result in complications if not addressed urgently such as respiratory distress, impairment of bodily function or dysfunction of bodily organs.   Routine symptom specific, illness specific and/or disease specific instructions were discussed with the patient and/or caregiver at length.   As such, the patient has been evaluated and assessed, work-up was performed and treatment was provided in alignment with urgent care protocols and evidence based medicine.  Patient/parent/caregiver has been advised that the patient may require follow up for further testing and treatment if the symptoms continue in spite of treatment, as clinically indicated and appropriate.  If the patient was tested for COVID-19, Influenza and/or RSV, then the patient/parent/guardian was advised to isolate at home pending the results of his/her diagnostic coronavirus test and potentially longer if theyre positive. I have also advised pt that if his/her COVID-19 test returns positive, it's recommended to self-isolate for at least 10 days after symptoms first appeared AND until fever-free for 24 hours without fever reducer AND other symptoms have improved or resolved. Discussed self-isolation recommendations as well as instructions for household member/close contacts as per the [facility] and [place] [facility], and also gave patient the [facility] packet with this information.  Patient/parent/caregiver has been advised to return to the [facility] or PCP in 3-5 days if no better; to PCP or the Emergency Department if new signs and symptoms develop, or if the current signs or symptoms continue to change or worsen for further workup, evaluation and treatment as clinically indicated and appropriate  The patient will follow up with their current PCP if and as advised. If the patient does not currently have a PCP we will assist them in obtaining one.   The  patient may need specialty follow up if the symptoms continue, in spite of conservative  treatment and management, for further workup, evaluation, consultation and treatment as clinically indicated and appropriate.  Patient/parent/caregiver verbalized understanding and agreement of plan as discussed.  All questions were addressed during visit.  Please see discharge instructions below for further details of plan.  Discharge Instructions:   Discharge Instructions      Your chest x-ray is reassuring, there is no concern for any lung disease.  Based on physical exam findings, I believe that you are suffering from significant allergies which are causing you to have frequent nosebleeds in the back part of your nose.  Instead of the blood running out the front of your nose, it is running down the back of your throat, clotting and you are coughing it up with the small bits of phlegm caused by your allergies.    To treat your nosebleeds, I recommend that you use an over-the-counter nasal spray staining an ingredient called oxymetazoline, 2 sprays in each nare until the nosebleeds stop completely.  Please do not use this nose spray any longer than 3 days.  Please asked the pharmacist to help you locate this medication on the pharmacy shelf.  Your symptoms and my physical exam findings are concerning for exacerbation of your underlying allergies.  It is important that you are consistent with taking allergy medications exactly as prescribed.     Please see the list below for recommended medications, dosages and frequencies to provide relief of your current symptoms:     Zyrtec (cetirizine): This is an excellent second-generation antihistamine that helps to reduce respiratory inflammatory response to environmental allergens.  In some patients, this medication can cause daytime sleepiness so I recommend that you take 1 tablet daily at bedtime.     Flonase (fluticasone): This is a steroid nasal spray that you use once daily, 1 spray in each nare.  This medication does not work well if you decide to  use it only used as you feel you need to, it works best used on a daily basis.  After 3 to 5 days of use, you will notice significant reduction of the inflammation and mucus production that is currently being caused by exposure to allergens, whether seasonal or environmental.  The most common side effect of this medication is nosebleeds.  If you experience a nosebleed, please discontinue use for 1 week, then feel free to resume.  I have provided you with a prescription but you can also purchase this medication over-the-counter if your insurance will not cover it.   If you find that you have not had significant relief of your symptoms in the next 7 to 10 days, please follow-up with your primary care provider   Thank you for visiting urgent care today.  We appreciate the opportunity to participate in your care.     This office note has been dictated using Museum/gallery curator.  Unfortunately, and despite my best efforts, this method of dictation can sometimes lead to occasional typographical or grammatical errors.  I apologize in advance if this occurs.     Lynden Oxford Scales, [place] 07/09/21 [phone]

## 2021-07-09 NOTE — Discharge Instructions (Addendum)
Your chest x-ray is reassuring, there is no concern for any lung disease.  Based on physical exam findings, I believe that you are suffering from significant allergies which are causing you to have frequent nosebleeds in the back part of your nose.  Instead of the blood running out the front of your nose, it is running down the back of your throat, clotting and you are coughing it up with the small bits of phlegm caused by your allergies.   ? ?To treat your nosebleeds, I recommend that you use an over-the-counter nasal spray staining an ingredient called oxymetazoline, 2 sprays in each nare until the nosebleeds stop completely.  Please do not use this nose spray any longer than 3 days.  Please asked the pharmacist to help you locate this medication on the pharmacy shelf. ? ?Your symptoms and my physical exam findings are concerning for exacerbation of your underlying allergies.  It is important that you are consistent with taking allergy medications exactly as prescribed.   ?  ?Please see the list below for recommended medications, dosages and frequencies to provide relief of your current symptoms:   ?  ?Zyrtec (cetirizine): This is an excellent second-generation antihistamine that helps to reduce respiratory inflammatory response to environmental allergens.  In some patients, this medication can cause daytime sleepiness so I recommend that you take 1 tablet daily at bedtime.   ?  ?Flonase (fluticasone): This is a steroid nasal spray that you use once daily, 1 spray in each nare.  This medication does not work well if you decide to use it only used as you feel you need to, it works best used on a daily basis.  After 3 to 5 days of use, you will notice significant reduction of the inflammation and mucus production that is currently being caused by exposure to allergens, whether seasonal or environmental.  The most common side effect of this medication is nosebleeds.  If you experience a nosebleed, please discontinue  use for 1 week, then feel free to resume.  I have provided you with a prescription but you can also purchase this medication over-the-counter if your insurance will not cover it. ?  ?If you find that you have not had significant relief of your symptoms in the next 7 to 10 days, please follow-up with your primary care provider ?  ?Thank you for visiting urgent care today.  We appreciate the opportunity to participate in your care. ? ?

## 2021-07-11 ENCOUNTER — Encounter: Payer: Self-pay | Admitting: Emergency Medicine

## 2021-07-11 ENCOUNTER — Ambulatory Visit
Admission: EM | Admit: 2021-07-11 | Discharge: 2021-07-11 | Disposition: A | Discharge status: Home / Self Care | Payer: Medicaid Other

## 2021-07-11 ENCOUNTER — Other Ambulatory Visit: Payer: Self-pay

## 2021-07-11 DIAGNOSIS — J069 Acute upper respiratory infection, unspecified: Secondary | ICD-10-CM | POA: Diagnosis not present

## 2021-07-11 NOTE — Discharge Instructions (Signed)
It appears that you have a viral upper respiratory infection that should run its course and self resolve in the next few days with symptomatic treatment.  COVID and flu test is pending. ?

## 2021-07-11 NOTE — ED Provider Notes (Signed)
?[place] ? ? ? ?CSN: [id number] ?Arrival date & time: 07/11/21  1021 ? ? ?  ? ?History   ?Chief Complaint ?Chief Complaint  ?Patient presents with  ? Generalized Body Aches  ? ? ?HPI ?Max Leach is a 41 y.o. male.  ? ?Patient presents with generalized body aches, runny nose, nasal congestion, nonproductive cough that has been present for approximately 1 to 2 days.  Patient was seen at a different urgent care when symptoms first started and diagnosed with allergic rhinitis and prescribed antihistamine with minimal improvement.  Body aches developed this morning upon awakening.  Denies any known fevers.  He does report that he has been around people with known similar symptoms at [facility].  Denies any known fevers, chest pain, shortness of breath, nausea, vomiting, diarrhea, abdominal pain ? ? ? ?Past Medical History:  ?Diagnosis Date  ? Arthritis   ? r knee  ? Hypertension   ? ? ?Patient Active Problem List  ? Diagnosis Date Noted  ? Tendinitis 11/21/2015  ? ? ?Past Surgical History:  ?Procedure Laterality Date  ? KNEE ARTHROPLASTY Right   ? ? ? ? ? ?Home Medications   ? ?Prior to Admission medications   ?Medication Sig Start Date End Date Taking? Authorizing Provider  ?cetirizine (ZYRTEC ALLERGY) 10 MG tablet Take 1 tablet (10 mg total) by mouth at bedtime. 07/09/21 10/07/21 Yes Lynden Oxford Scales, PA-C  ?fluticasone (FLONASE) 50 MCG/ACT nasal spray Place 2 sprays into both nostrils daily. 07/09/21  Yes Lynden Oxford Scales, PA-C  ?Furosemide (LASIX PO) Take by mouth.   Yes [provider]  ?meloxicam (MOBIC) 15 MG tablet Take 15 mg by mouth daily. 01/18/21  Yes [provider]  ? ? ?Family History ?Family History  ?Problem Relation Age of Onset  ? Arthritis Mother   ? ? ?Social History ?Social History  ? ?Tobacco Use  ? Smoking status: Every Day  ?  Packs/day: 0.00  ?  Types: Cigarettes  ? Smokeless tobacco: Current  ?Substance Use Topics  ? Alcohol use: Yes  ? Drug  use: Yes  ?  Types: Marijuana  ? ? ? ?Allergies   ?Shrimp [shellfish allergy] ? ? ?Review of Systems ?Review of Systems ?Per HPI ? ?Physical Exam ?Triage Vital Signs ?ED Triage Vitals  ?Enc Vitals Group  ?   BP 07/11/21 1059 (!) 146/83  ?   Pulse Rate 07/11/21 1059 86  ?   Resp 07/11/21 1059 18  ?   Temp 07/11/21 1059 99.1 ?F (37.3 ?C)  ?   Temp Source 07/11/21 1059 Oral  ?   SpO2 07/11/21 1059 94 %  ?   Weight 07/11/21 1100 259 lb 14.8 oz (117.9 kg)  ?   Height 07/11/21 1100 '6\' 2"'$  (1.88 m)  ?   Head Circumference --   ?   Peak Flow --   ?   Pain Score 07/11/21 1100 8  ?   Pain Loc --   ?   Pain Edu? --   ?   Excl. in [place]? --   ? ?No data found. ? ?Updated Vital Signs ?BP (!) 146/83 (BP Location: Left Arm)   Pulse 86   Temp 99.1 ?F (37.3 ?C) (Oral)   Resp 18   Ht '6\' 2"'$  (1.88 m)   Wt 259 lb 14.8 oz (117.9 kg)   SpO2 94%   BMI 33.37 kg/m?  ? ?Visual Acuity ?Right Eye Distance:   ?Left Eye Distance:   ?Bilateral Distance:   ? ?  Right Eye Near:   ?Left Eye Near:    ?Bilateral Near:    ? ?Physical Exam ?Constitutional:   ?   General: He is not in acute distress. ?   Appearance: Normal appearance. He is not toxic-appearing or diaphoretic.  ?HENT:  ?   Head: Normocephalic and atraumatic.  ?   Right Ear: Tympanic membrane and ear canal normal.  ?   Left Ear: Tympanic membrane and ear canal normal.  ?   Nose: Congestion present.  ?   Mouth/Throat:  ?   Mouth: Mucous membranes are moist.  ?   Pharynx: No posterior oropharyngeal erythema.  ?Eyes:  ?   Extraocular Movements: Extraocular movements intact.  ?   Conjunctiva/sclera: Conjunctivae normal.  ?   Pupils: Pupils are equal, round, and reactive to light.  ?Cardiovascular:  ?   Rate and Rhythm: Normal rate and regular rhythm.  ?   Pulses: Normal pulses.  ?   Heart sounds: Normal heart sounds.  ?Pulmonary:  ?   Effort: Pulmonary effort is normal. No respiratory distress.  ?   Breath sounds: Normal breath sounds. No stridor. No wheezing, rhonchi or rales.  ?Abdominal:  ?    General: Abdomen is flat. Bowel sounds are normal.  ?   Palpations: Abdomen is soft.  ?Musculoskeletal:     ?   General: Normal range of motion.  ?   Cervical back: Normal range of motion.  ?Skin: ?   General: Skin is warm and dry.  ?Neurological:  ?   General: No focal deficit present.  ?   Mental Status: He is alert and oriented to person, place, and time. Mental status is at baseline.  ?Psychiatric:     ?   Mood and Affect: Mood normal.     ?   Behavior: Behavior normal.  ? ? ? ?UC Treatments / Results  ?Labs ?(all labs ordered are listed, but only abnormal results are displayed) ?Labs Reviewed  ?COVID-19, FLU A+B NAA  ? ? ?EKG ? ? ?Radiology ?DG Chest 2 View ? ?Result Date: 07/09/2021 ?CLINICAL DATA:  Hemoptysis EXAM: CHEST - 2 VIEW COMPARISON:  None. FINDINGS: The heart and mediastinal contours are within normal limits. No focal consolidation. No pulmonary edema. No pleural effusion. No pneumothorax. No acute osseous abnormality. IMPRESSION: No active cardiopulmonary disease. Electronically Signed   By: Iven Finn M.D.   On: 07/09/2021 18:40   ? ?Procedures ?Procedures (including critical care time) ? ?Medications Ordered in UC ?Medications - No data to display ? ?Initial Impression / Assessment and Plan / UC Course  ?I have reviewed the triage vital signs and the nursing notes. ? ?Pertinent labs & imaging results that were available during my care of the patient were reviewed by me and considered in my medical decision making (see chart for details). ? ?  ? ?Patient presents with symptoms likely from a viral upper respiratory infection. Differential includes bacterial pneumonia, sinusitis, allergic rhinitis, COVID-19, flu. Do not suspect underlying cardiopulmonary process. Symptoms seem unlikely related to ACS, CHF or COPD exacerbations, pneumonia, pneumothorax. Patient is nontoxic appearing and not in need of emergent medical intervention.  COVID and flu test pending. ? ?Recommended symptom control with  over the counter medications: Daily oral anti-histamine, Oral decongestant or IN corticosteroid, saline irrigations, cepacol lozenges, Robitussin, Delsym, honey tea. ? ?Return if symptoms fail to improve in 1-2 weeks or you develop shortness of breath, chest pain, severe headache. Patient states understanding and is agreeable. ? ?Discharged with PCP  followup.  ?Final Clinical Impressions(s) / UC Diagnoses  ? ?Final diagnoses:  ?Viral upper respiratory tract infection with cough  ? ? ? ?Discharge Instructions   ? ?  ?It appears that you have a viral upper respiratory infection that should run its course and self resolve in the next few days with symptomatic treatment.  COVID and flu test is pending. ? ? ? ?ED Prescriptions   ?None ?  ? ?PDMP not reviewed this encounter. ?  ?Teodora Medici, [place] ?07/11/21 1146 ? ?

## 2021-07-11 NOTE — ED Triage Notes (Signed)
Patient c/o generalized body aches that started early this morning.  No other sx's, denies any OTC meds. ?

## 2021-07-12 LAB — COVID-19, FLU A+B NAA
Influenza A, NAA: NOT DETECTED
Influenza B, NAA: NOT DETECTED
SARS-CoV-2, NAA: DETECTED — AB

## 2021-08-28 ENCOUNTER — Other Ambulatory Visit: Payer: Self-pay | Admitting: Internal Medicine

## 2021-08-29 LAB — TSH: TSH: 1.48 mIU/L (ref 0.40–4.50)

## 2021-08-29 LAB — COMPLETE METABOLIC PANEL WITH GFR
AG Ratio: 1.6 (calc) (ref 1.0–2.5)
ALT: 20 U/L (ref 9–46)
AST: 24 U/L (ref 10–40)
Albumin: 4.2 g/dL (ref 3.6–5.1)
Alkaline phosphatase (APISO): 61 U/L (ref 36–130)
BUN: 10 mg/dL (ref 7–25)
CO2: 25 mmol/L (ref 20–32)
Calcium: 9.4 mg/dL (ref 8.6–10.3)
Chloride: 109 mmol/L (ref 98–110)
Creat: 0.85 mg/dL (ref 0.60–1.29)
Globulin: 2.6 g/dL (calc) (ref 1.9–3.7)
Glucose, Bld: 86 mg/dL (ref 65–99)
Potassium: 4.2 mmol/L (ref 3.5–5.3)
Sodium: 142 mmol/L (ref 135–146)
Total Bilirubin: 0.9 mg/dL (ref 0.2–1.2)
Total Protein: 6.8 g/dL (ref 6.1–8.1)
eGFR: 113 mL/min/{1.73_m2} (ref >=60)

## 2021-08-29 LAB — CBC
HCT: 39.8 % (ref 38.5–50.0)
Hemoglobin: 13.2 g/dL (ref 13.2–17.1)
MCH: 27.9 pg (ref 27.0–33.0)
MCHC: 33.2 g/dL (ref 32.0–36.0)
MCV: 84.1 fL (ref 80.0–100.0)
MPV: 12.5 fL (ref 7.5–12.5)
Platelets: 207 10*3/uL (ref 140–400)
RBC: 4.73 10*6/uL (ref 4.20–5.80)
RDW: 14.1 % (ref 11.0–15.0)
WBC: 4.9 10*3/uL (ref 3.8–10.8)

## 2021-08-29 LAB — LIPID PANEL
Cholesterol: 165 mg/dL (ref <200)
HDL: 47 mg/dL (ref >=40)
LDL Cholesterol (Calc): 103 mg/dL (calc) — ABNORMAL HIGH
Non-HDL Cholesterol (Calc): 118 mg/dL (calc) (ref <130)
Total CHOL/HDL Ratio: 3.5 (calc) (ref <5.0)
Triglycerides: 64 mg/dL (ref <150)

## 2021-08-29 LAB — VITAMIN D 25 HYDROXY (VIT D DEFICIENCY, FRACTURES): Vit D, 25-Hydroxy: 13 ng/mL — ABNORMAL LOW (ref 30–100)

## 2021-08-29 LAB — PSA: PSA: 0.42 ng/mL (ref <=4.00)

## 2021-11-04 ENCOUNTER — Ambulatory Visit
Admission: EM | Admit: 2021-11-04 | Discharge: 2021-11-04 | Disposition: A | Discharge status: Home / Self Care | Payer: Medicaid Other | Attending: Physician Assistant | Admitting: Physician Assistant

## 2021-11-04 DIAGNOSIS — J020 Streptococcal pharyngitis: Secondary | ICD-10-CM

## 2021-11-04 LAB — POCT RAPID STREP A (OFFICE): Rapid Strep A Screen: POSITIVE — AB

## 2021-11-04 MED ORDER — AMOXICILLIN 500 MG PO CAPS: 500.0000 mg | ORAL_CAPSULE | Freq: Three times a day (TID) | ORAL | 0 refills | Status: DC

## 2021-11-04 NOTE — ED Provider Notes (Signed)
[facility]    CSN: [id number] Arrival date & time: 11/04/21  0840      History   Chief Complaint Chief Complaint  Patient presents with   Generalized Body Aches   Chills   Sore Throat   Abdominal Pain    HPI Max Leach is a 41 y.o. male.   The history is provided by the patient. No language interpreter was used.  Sore Throat This is a new problem. The current episode started more than 2 days ago. The problem occurs constantly. The problem has not changed since onset.Associated symptoms include abdominal pain. Nothing aggravates the symptoms. Nothing relieves the symptoms. He has tried nothing for the symptoms. The treatment provided no relief.  Abdominal Pain   Past Medical History:  Diagnosis Date   Arthritis    r knee   Hypertension     Patient Active Problem List   Diagnosis Date Noted   Tendinitis 11/21/2015    Past Surgical History:  Procedure Laterality Date   KNEE ARTHROPLASTY Right        Home Medications    Prior to Admission medications   Medication Sig Start Date End Date Taking? Authorizing Provider  amoxicillin (AMOXIL) 500 MG capsule Take 1 capsule (500 mg total) by mouth 3 (three) times daily. 11/04/21  Yes Fransico Meadow, PA-C  cetirizine (ZYRTEC ALLERGY) 10 MG tablet Take 1 tablet (10 mg total) by mouth at bedtime. 07/09/21 10/07/21  Lynden Oxford Scales, PA-C  fluticasone (FLONASE) 50 MCG/ACT nasal spray Place 2 sprays into both nostrils daily. 07/09/21   Lynden Oxford Scales, PA-C  Furosemide (LASIX PO) Take by mouth.    [provider]  meloxicam (MOBIC) 15 MG tablet Take 15 mg by mouth daily. 01/18/21   [provider]    Family History Family History  Problem Relation Age of Onset   Arthritis Mother     Social History Social History   Tobacco Use   Smoking status: Every Day    Packs/day: 0.00    Types: Cigarettes   Smokeless tobacco: Current  Substance Use Topics   Alcohol use: Yes    Drug use: Yes    Types: Marijuana     Allergies   Shrimp [shellfish allergy]   Review of Systems Review of Systems  Gastrointestinal:  Positive for abdominal pain.  All other systems reviewed and are negative.    Physical Exam Triage Vital Signs ED Triage Vitals  Enc Vitals Group     BP 11/04/21 0912 121/76     Pulse Rate 11/04/21 0911 94     Resp 11/04/21 0911 20     Temp 11/04/21 0911 99.2 F (37.3 C)     Temp Source 11/04/21 0911 Oral     SpO2 11/04/21 0911 98 %     Weight --      Height --      Head Circumference --      Peak Flow --      Pain Score 11/04/21 0910 6     Pain Loc --      Pain Edu? --      Excl. in [place]? --    No data found.  Updated Vital Signs BP 121/76   Pulse 94   Temp 99.2 F (37.3 C) (Oral)   Resp 20   SpO2 98%   Visual Acuity Right Eye Distance:   Left Eye Distance:   Bilateral Distance:    Right Eye Near:   Left Eye Near:  Bilateral Near:     Physical Exam Vitals and nursing note reviewed.  Constitutional:      Appearance: He is well-developed.  HENT:     Head: Normocephalic.     Mouth/Throat:     Pharynx: Pharyngeal swelling and posterior oropharyngeal erythema present.     Tonsils: Tonsillar exudate present.  Eyes:     Conjunctiva/sclera: Conjunctivae normal.  Pulmonary:     Effort: Pulmonary effort is normal.  Abdominal:     General: There is no distension.  Musculoskeletal:        General: Normal range of motion.     Cervical back: Normal range of motion.  Neurological:     Mental Status: He is alert and oriented to person, place, and time.  Psychiatric:        Mood and Affect: Mood normal.      UC Treatments / Results  Labs (all labs ordered are listed, but only abnormal results are displayed) Labs Reviewed  POCT RAPID STREP A (OFFICE) - Abnormal; Notable for the following components:      Result Value   Rapid Strep A Screen Positive (*)    All other components within normal limits     EKG   Radiology No results found.  Procedures Procedures (including critical care time)  Medications Ordered in UC Medications - No data to display  Initial Impression / Assessment and Plan / UC Course  I have reviewed the triage vital signs and the nursing notes.  Pertinent labs & imaging results that were available during my care of the patient were reviewed by me and considered in my medical decision making (see chart for details).     Strep is positive.   Final Clinical Impressions(s) / UC Diagnoses   Final diagnoses:  Streptococcal sore throat     Discharge Instructions      Return if any problems.   ED Prescriptions     Medication Sig Dispense Auth. Provider   amoxicillin (AMOXIL) 500 MG capsule Take 1 capsule (500 mg total) by mouth 3 (three) times daily. 30 capsule Fransico Meadow, [place]      PDMP not reviewed this encounter. An After Visit Summary was printed and given to the patient.    Fransico Meadow, [place] 11/04/21 1020

## 2021-11-04 NOTE — ED Triage Notes (Signed)
Pt presents with generalized abdominal nausea, chills, sore throat, and generalized body aches X 2 days.

## 2021-11-04 NOTE — Discharge Instructions (Signed)
Return if any problems.

## 2022-01-12 ENCOUNTER — Encounter: Payer: Self-pay | Admitting: Physician Assistant

## 2022-01-12 ENCOUNTER — Ambulatory Visit
Admission: EM | Admit: 2022-01-12 | Discharge: 2022-01-12 | Disposition: A | Discharge status: Home / Self Care | Payer: Medicaid Other | Attending: Physician Assistant | Admitting: Physician Assistant

## 2022-01-12 DIAGNOSIS — L0291 Cutaneous abscess, unspecified: Secondary | ICD-10-CM

## 2022-01-12 MED ORDER — DOXYCYCLINE HYCLATE 100 MG PO CAPS: 100.0000 mg | ORAL_CAPSULE | Freq: Two times a day (BID) | ORAL | 0 refills | Status: DC

## 2022-01-12 NOTE — ED Triage Notes (Signed)
Pt has hx of boils and said Monday started having pin on left butt cheek and has gotten worse over the past few days.

## 2022-01-12 NOTE — ED Provider Notes (Signed)
[name]    CSN: [id number] Arrival date & time: 01/12/22  4627      History   Chief Complaint Chief Complaint  Patient presents with   Abscess    HPI Max Leach is a 41 y.o. male.   Patient here today for evaluation of pain to his left gluteus that he first noted 5 days ago.  He reports that pain has worsened with time.  He has had some drainage from area and does have history of boils that are similar.  He does not report any treatment for symptoms.  He denies any fever, nausea or vomiting.  The history is provided by the patient.  Abscess Associated symptoms: no fever     Past Medical History:  Diagnosis Date   Arthritis    r knee   Hypertension     Patient Active Problem List   Diagnosis Date Noted   Tendinitis 11/21/2015    Past Surgical History:  Procedure Laterality Date   KNEE ARTHROPLASTY Right        Home Medications    Prior to Admission medications   Medication Sig Start Date End Date Taking? Authorizing Provider  doxycycline (VIBRAMYCIN) 100 MG capsule Take 1 capsule (100 mg total) by mouth 2 (two) times daily. 01/12/22  Yes Francene Finders, PA-C  cetirizine (ZYRTEC ALLERGY) 10 MG tablet Take 1 tablet (10 mg total) by mouth at bedtime. 07/09/21 10/07/21  Lynden Oxford Scales, PA-C  fluticasone (FLONASE) 50 MCG/ACT nasal spray Place 2 sprays into both nostrils daily. 07/09/21   Lynden Oxford Scales, PA-C  Furosemide (LASIX PO) Take by mouth.    [provider]  meloxicam (MOBIC) 15 MG tablet Take 15 mg by mouth daily. 01/18/21   [provider]    Family History Family History  Problem Relation Age of Onset   Arthritis Mother     Social History Social History   Tobacco Use   Smoking status: Every Day    Packs/day: 0.00    Types: Cigarettes   Smokeless tobacco: Current  Substance Use Topics   Alcohol use: Yes   Drug use: Yes    Types: Marijuana     Allergies   Shrimp [shellfish  allergy]   Review of Systems Review of Systems  Constitutional:  Negative for chills and fever.  Eyes:  Negative for discharge and redness.  Skin:  Positive for color change and wound.     Physical Exam Triage Vital Signs ED Triage Vitals  Enc Vitals Group     BP      Pulse      Resp      Temp      Temp src      SpO2      Weight      Height      Head Circumference      Peak Flow      Pain Score      Pain Loc      Pain Edu?      Excl. in [place]?    No data found.  Updated Vital Signs BP (!) 146/91 (BP Location: Right Arm)   Pulse 87   Temp (!) 97.4 F (36.3 C) (Oral)   Resp 16   SpO2 97%      Physical Exam Vitals and nursing note reviewed.  Constitutional:      General: He is not in acute distress.    Appearance: Normal appearance. He is not ill-appearing.  HENT:  Head: Normocephalic and atraumatic.  Eyes:     Conjunctiva/sclera: Conjunctivae normal.  Cardiovascular:     Rate and Rhythm: Normal rate.  Pulmonary:     Effort: Pulmonary effort is normal.  Skin:    General: Skin is warm and dry.     Comments: Area of induration, erythema noted to left gluteus with purulent drainage noted  Neurological:     Mental Status: He is alert.  Psychiatric:        Mood and Affect: Mood normal.        Behavior: Behavior normal.        Thought Content: Thought content normal.      UC Treatments / Results  Labs (all labs ordered are listed, but only abnormal results are displayed) Labs Reviewed - No data to display  EKG   Radiology No results found.  Procedures Procedures (including critical care time)  Medications Ordered in UC Medications - No data to display  Initial Impression / Assessment and Plan / UC Course  I have reviewed the triage vital signs and the nursing notes.  Pertinent labs & imaging results that were available during my care of the patient were reviewed by me and considered in my medical decision making (see chart for  details).    Doxycycline prescribed to cover infection.  Discussed warm compresses to help promote spontaneous drainage of abscess.  Patient expresses understanding.  Recommended follow-up if no gradual improvement or with any further concerns.  Final Clinical Impressions(s) / UC Diagnoses   Final diagnoses:  Abscess   Discharge Instructions   None    ED Prescriptions     Medication Sig Dispense Auth. Provider   doxycycline (VIBRAMYCIN) 100 MG capsule Take 1 capsule (100 mg total) by mouth 2 (two) times daily. 20 capsule Francene Finders, PA-C      PDMP not reviewed this encounter.   Francene Finders, PA-C 01/12/22 1233

## 2022-01-22 ENCOUNTER — Encounter (HOSPITAL_COMMUNITY): Payer: Self-pay

## 2022-01-22 ENCOUNTER — Emergency Department (HOSPITAL_COMMUNITY)
Admission: EM | Admit: 2022-01-22 | Discharge: 2022-01-23 | Discharge status: Against Medical Advice | Payer: Medicaid Other | Attending: Emergency Medicine | Admitting: Emergency Medicine

## 2022-01-22 ENCOUNTER — Other Ambulatory Visit: Payer: Self-pay

## 2022-01-22 DIAGNOSIS — R1031 Right lower quadrant pain: Secondary | ICD-10-CM | POA: Diagnosis not present

## 2022-01-22 DIAGNOSIS — Z5321 Procedure and treatment not carried out due to patient leaving prior to being seen by health care provider: Secondary | ICD-10-CM | POA: Insufficient documentation

## 2022-01-22 LAB — CBC WITH DIFFERENTIAL/PLATELET
Abs Immature Granulocytes: 0.01 10*3/uL (ref 0.00–0.07)
Basophils Absolute: 0 10*3/uL (ref 0.0–0.1)
Basophils Relative: 0 %
Eosinophils Absolute: 0.2 10*3/uL (ref 0.0–0.5)
Eosinophils Relative: 3 %
HCT: 39.8 % (ref 39.0–52.0)
Hemoglobin: 13.6 g/dL (ref 13.0–17.0)
Immature Granulocytes: 0 %
Lymphocytes Relative: 47 %
Lymphs Abs: 3.1 10*3/uL (ref 0.7–4.0)
MCH: 27.7 pg (ref 26.0–34.0)
MCHC: 34.2 g/dL (ref 30.0–36.0)
MCV: 81.1 fL (ref 80.0–100.0)
Monocytes Absolute: 0.7 10*3/uL (ref 0.1–1.0)
Monocytes Relative: 11 %
Neutro Abs: 2.6 10*3/uL (ref 1.7–7.7)
Neutrophils Relative %: 39 %
Platelets: 240 10*3/uL (ref 150–400)
RBC: 4.91 MIL/uL (ref 4.22–5.81)
RDW: 14.2 % (ref 11.5–15.5)
WBC: 6.6 10*3/uL (ref 4.0–10.5)
nRBC: 0 % (ref 0.0–0.2)

## 2022-01-22 LAB — URINALYSIS, ROUTINE W REFLEX MICROSCOPIC
Bilirubin Urine: NEGATIVE
Glucose, UA: NEGATIVE mg/dL
Hgb urine dipstick: NEGATIVE
Ketones, ur: NEGATIVE mg/dL
Leukocytes,Ua: NEGATIVE
Nitrite: NEGATIVE
Protein, ur: NEGATIVE mg/dL
Specific Gravity, Urine: 1.015 (ref 1.005–1.030)
pH: 8 (ref 5.0–8.0)

## 2022-01-22 LAB — COMPREHENSIVE METABOLIC PANEL
ALT: 22 U/L (ref 0–44)
AST: 25 U/L (ref 15–41)
Albumin: 4.3 g/dL (ref 3.5–5.0)
Alkaline Phosphatase: 69 U/L (ref 38–126)
Anion gap: 6 (ref 5–15)
BUN: 14 mg/dL (ref 6–20)
CO2: 24 mmol/L (ref 22–32)
Calcium: 9.1 mg/dL (ref 8.9–10.3)
Chloride: 109 mmol/L (ref 98–111)
Creatinine, Ser: 0.81 mg/dL (ref 0.61–1.24)
GFR, Estimated: >60 mL/min (ref >60)
Glucose, Bld: 103 mg/dL — ABNORMAL HIGH (ref 70–99)
Potassium: 3.7 mmol/L (ref 3.5–5.1)
Sodium: 139 mmol/L (ref 135–145)
Total Bilirubin: 0.6 mg/dL (ref 0.3–1.2)
Total Protein: 7.6 g/dL (ref 6.5–8.1)

## 2022-01-22 LAB — LIPASE, BLOOD: Lipase: 29 U/L (ref 11–51)

## 2022-01-22 NOTE — ED Triage Notes (Signed)
Right flank pain beginning today. Aggravated with movement. Denies injury. Denies dysuria.

## 2022-01-22 NOTE — ED Provider Triage Note (Signed)
Emergency Medicine Provider Triage Evaluation Note  Max Leach , a 41 y.o. male  was evaluated in triage.  Pt complains of right flank pain of sudden onset approximately 6 hours ago.  Describing as a constant ache radiating into his right lower quadrant.  Denies lifting anything heavy, states he did not take anything for pain control.  No prior history of kidney stones.  Not endorsing any nausea or vomiting.  No prior surgical history to his abdomen, no urinary symptoms.  Review of Systems  Positive: Flank pain,  Negative: Nausea, vomiting  Physical Exam  BP (!) 145/78   Pulse 82   Temp 97.8 F (36.6 C)   Resp 20   Ht '6\' 2"'$  (1.88 m)   Wt 117.5 kg   SpO2 98%   BMI 33.25 kg/m  Gen:   Awake, no distress   Resp:  Normal effort  MSK:   Moves extremities without difficulty Other:  nO CVA tenderness  Medical Decision Making  Medically screening exam initiated at 9:34 PM.  Appropriate orders placed.  Max Leach was informed that the remainder of the evaluation will be completed by another provider, this initial triage assessment does not replace that evaluation, and the importance of remaining in the ED until their evaluation is complete.     Janeece Fitting, PA-C 01/22/22 2137

## 2022-01-23 NOTE — ED Notes (Signed)
Pt states that he is going to go ahead and leave.

## 2022-02-08 ENCOUNTER — Ambulatory Visit: Payer: Self-pay

## 2022-02-08 ENCOUNTER — Ambulatory Visit: Payer: Medicaid Other | Admitting: Sports Medicine

## 2022-02-08 ENCOUNTER — Encounter: Payer: Self-pay | Admitting: Sports Medicine

## 2022-02-08 VITALS — BP 135/76 | Ht 74.0 in | Wt 283.0 lb

## 2022-02-08 DIAGNOSIS — M1711 Unilateral primary osteoarthritis, right knee: Secondary | ICD-10-CM | POA: Diagnosis present

## 2022-02-08 DIAGNOSIS — G8929 Other chronic pain: Secondary | ICD-10-CM

## 2022-02-08 MED ORDER — METHYLPREDNISOLONE ACETATE 40 MG/ML IJ SUSP
40.0000 mg | Freq: Once | INTRAMUSCULAR | Status: AC
  Administered 2022-02-08: 40 mg via INTRA_ARTICULAR

## 2022-02-08 MED ORDER — MELOXICAM 15 MG PO TABS: ORAL_TABLET | ORAL | 2 refills | Status: AC

## 2022-02-08 NOTE — Assessment & Plan Note (Signed)
I was able to review his x-rays from 2018 which showed advanced osteoarthritis with spurring and a loose body superior to the patella.  He has had steroid injections in the past with some relief. Right knee cortisone injection administered today, as detailed below.  Referral for orthopedic surgery was also placed.  I would like for patient to discuss his options with the surgeon as he has rather advanced osteoarthritis back in 2018 I anticipate this has worsened.  We will hold off on x-rays as I am confident orthopedic surgery will repeat them.  I did send 15 mg daily meloxicam to his pharmacy to be used for his pain as needed.  Follow-up in 4 to 6 weeks reevaluate knee pain.

## 2022-02-08 NOTE — Patient Instructions (Signed)
Dr Berniece Pap & [facility] Monday 02/11/22 @ 12:15p [place] [phone]  Follow up in 4-6 weeks

## 2022-02-08 NOTE — Progress Notes (Signed)
New Patient Office Visit  Subjective   Patient ID: Max Leach, male    DOB: 12-20-1980  Age: 41 y.o. MRN: [id number]  Right knee pain.  Max Leach presents today with chief complaint of right knee pain.  He states he has been experiencing this for multiple years now.  When he was 25 he had an acute injury while playing basketball.  He underwent arthroscopic medial meniscectomy.  The doctor told him this probably happened from playing on a hard basketball court and to stay off his knees as much as he could.  He has lost substantial weight but at 1 point in time he weighed over 400 pounds which his doctor also attributed to his knee pain.  He has had cortisone injections into this knee into the past that gave him relief for a couple of months.  He has not had one in over a year.  His knee pain began flaring up a couple of weeks ago, to the extent to where it is waking him from sleep.  He has tried ice, heat, oral medications with no relief.  He denies any buckling of his knee however he reports he has decreased range of motion and has been bowlegged since he was a child.   ROS as listed above in HPI    Objective:     BP 135/76   Ht '6\' 2"'$  (1.88 m)   Wt 283 lb (128.4 kg)   BMI 36.34 kg/m   Physical Exam Vitals reviewed.  Constitutional:      General: He is not in acute distress.    Appearance: He is obese. He is not ill-appearing, toxic-appearing or diaphoretic.  HENT:     Head: Normocephalic.  Pulmonary:     Effort: Pulmonary effort is normal.  Musculoskeletal:     Comments: Antalgic gait  Neurological:     Mental Status: He is alert.   Right knee: There is an obvious varus deformity.  He has some tenderness to palpation along medial joint line.  Decreased range of motion from about 10 degrees of extension to 85 degrees of flexion.  Stable to varus and valgus stress.  Negative Lachman's.  Ultrasound of Knee-right knee  Patellar tendon -visualized, no hypoechoic changes Quad  tendon -visualized, no hypoechoic changes Suprapatellar pouch -no effusion was appreciated Medial meniscus-visualized, no obvious tears or protrusion Lateral meniscus-visualized, no obvious tears or protrusion  Summary and additional findings-there is no identifiable effusion or obvious meniscal tears. Did not visualized any loose body in leg quad tendon or distal quadricep muscle that corroborated with his 2018 right knee x-ray.   Assessment & Plan:   Problem List Items Addressed This Visit       Musculoskeletal and Integument   Primary osteoarthritis of right knee - Primary    I was able to review his x-rays from 2018 which showed advanced osteoarthritis with spurring and a loose body superior to the patella.  He has had steroid injections in the past with some relief. Right knee cortisone injection administered today, as detailed below.  Referral for orthopedic surgery was also placed.  I would like for patient to discuss his options with the surgeon as he has rather advanced osteoarthritis back in 2018 I anticipate this has worsened.  We will hold off on x-rays as I am confident orthopedic surgery will repeat them.  I did send 15 mg daily meloxicam to his pharmacy to be used for his pain as needed.  Follow-up in 4 to 6 weeks  reevaluate knee pain.      Relevant Medications   meloxicam (MOBIC) 15 MG tablet   Other Relevant Orders   [place] LIMITED JOINT SPACE STRUCTURES LOW RIGHT   After informed written consent timeout was performed, patient was seated on exam table. Right knee was prepped with alcohol swab and utilizing anterolateral approach, patient's right knee was injected intraarticularly with 3:1 lidocaine: depomedrol. Patient tolerated the procedure well without immediate complications.  Return in about 4 weeks (around 03/08/2022), or sooner if symptoms worsen or fail to improve.    Elmore Guise, DO  Addendum:  Patient seen in the office by fellow.  Her history, exam, plan  of care were precepted with me.  Karlton Lemon MD [facility] Boys

## 2022-03-13 ENCOUNTER — Ambulatory Visit: Payer: Medicaid Other | Admitting: Sports Medicine

## 2022-04-15 IMAGING — DX DG CHEST 2V
2 series · 2 of 2 positions shown · non-contrast
Comparison: None.

CLINICAL DATA: Hemoptysis

EXAM:
CHEST - 2 VIEW

[chest pa]
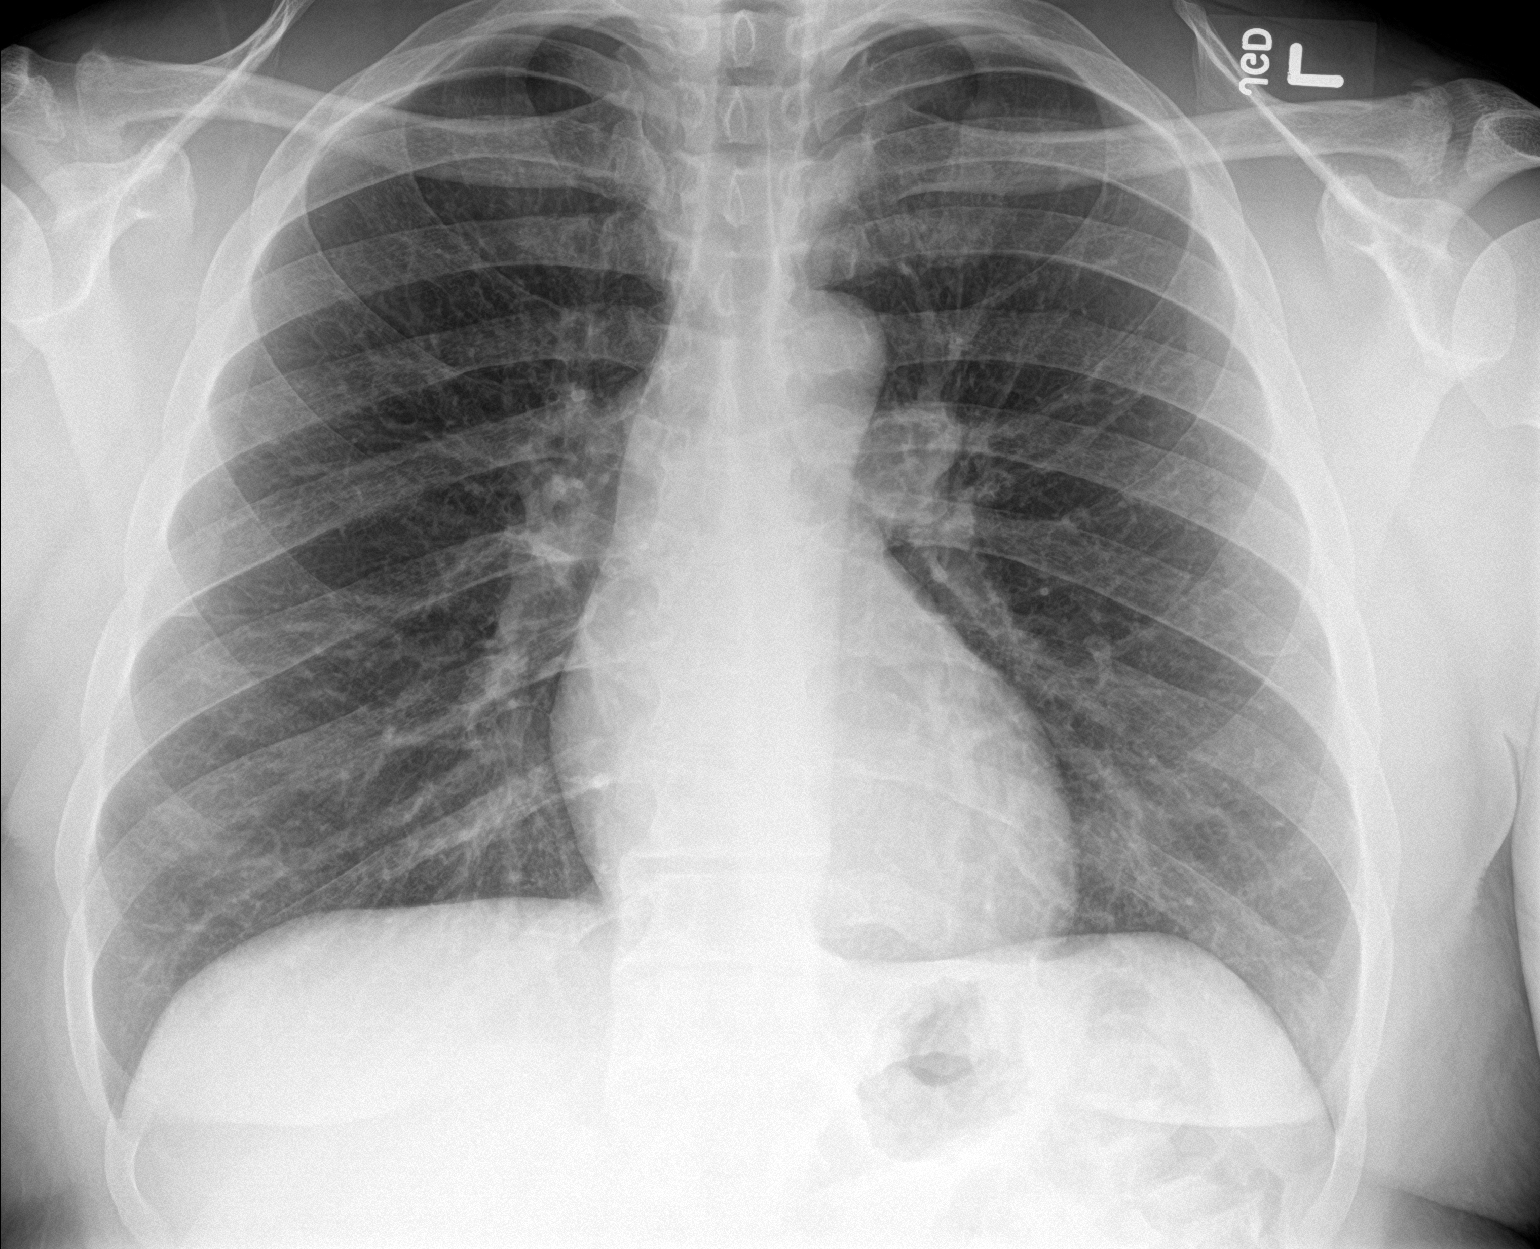

[chest lat]
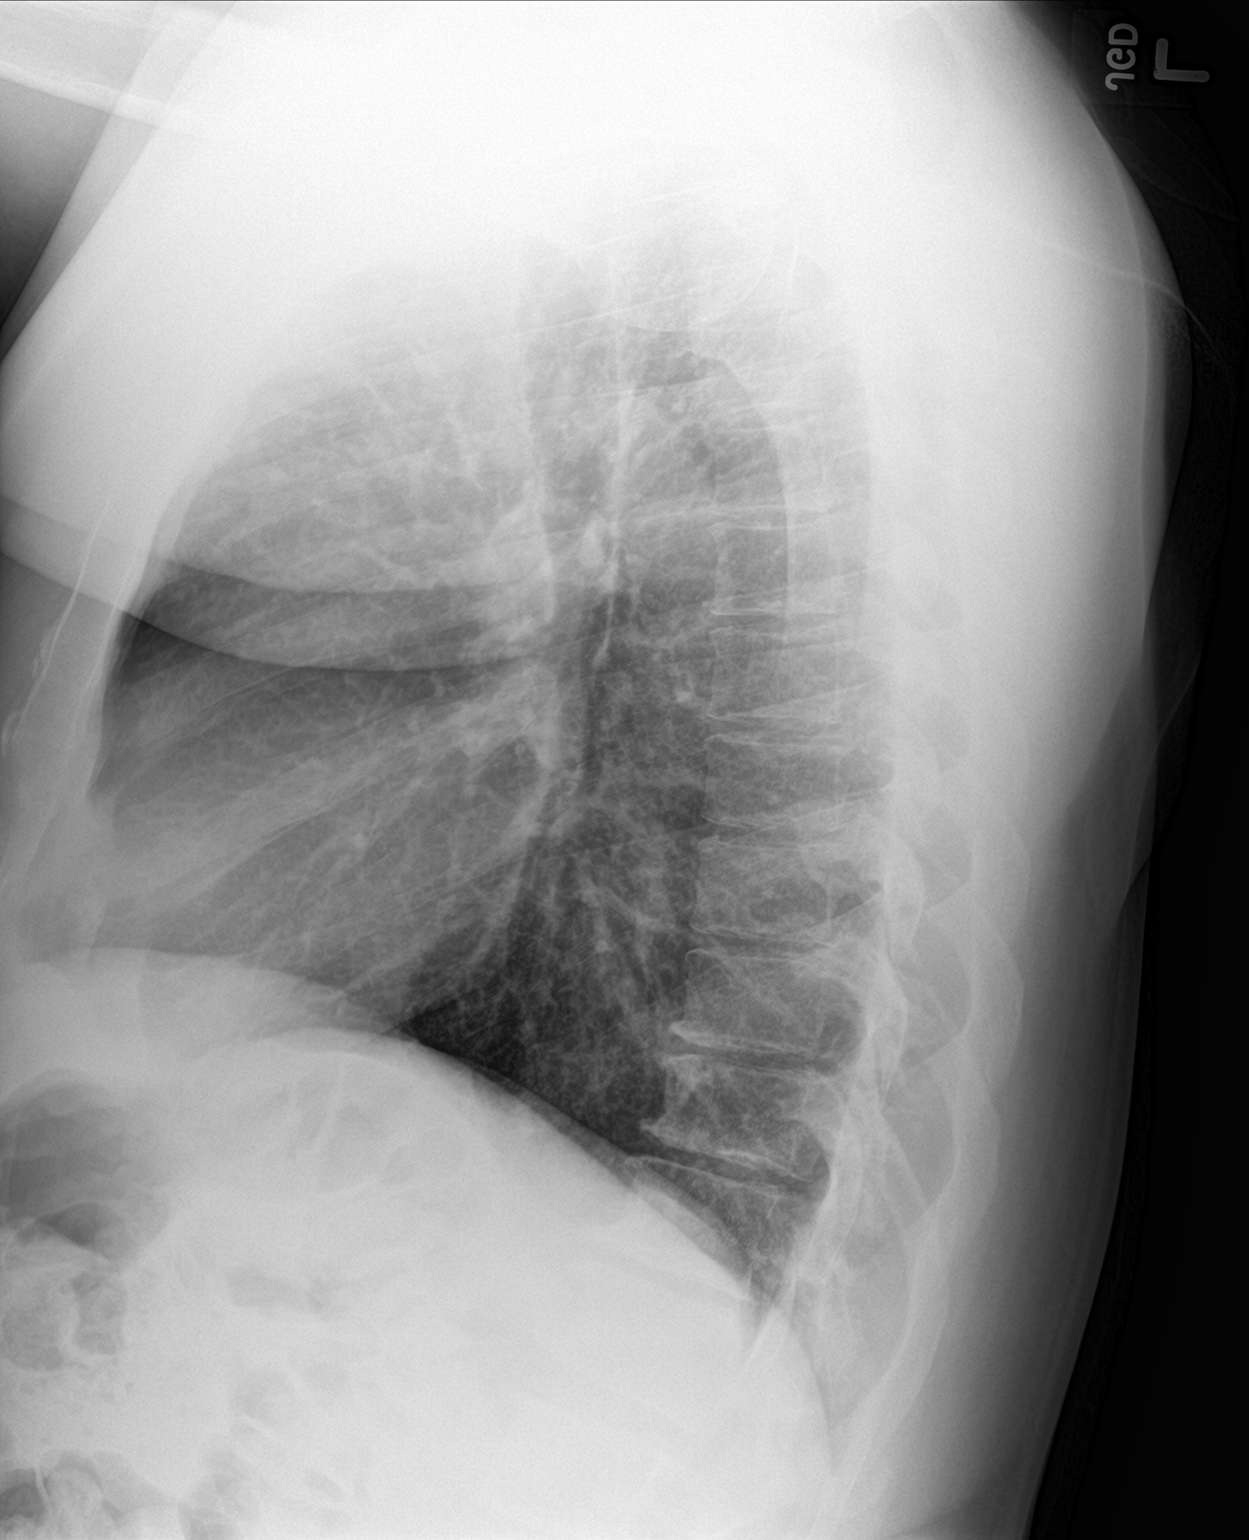

[2 of 2 positions shown; findings below may reference images not displayed]

FINDINGS: The heart and mediastinal contours are within normal limits.

No focal consolidation. No pulmonary edema. No pleural effusion. No
pneumothorax.

No acute osseous abnormality.
IMPRESSION: No active cardiopulmonary disease.

## 2023-01-09 ENCOUNTER — Ambulatory Visit
Admission: EM | Admit: 2023-01-09 | Discharge: 2023-01-09 | Disposition: A | Discharge status: Home / Self Care | Payer: 59 | Attending: Internal Medicine | Admitting: Internal Medicine

## 2023-01-09 DIAGNOSIS — M25511 Pain in right shoulder: Secondary | ICD-10-CM | POA: Diagnosis not present

## 2023-01-09 MED ORDER — PREDNISONE 20 MG PO TABS: 40.0000 mg | ORAL_TABLET | Freq: Every day | ORAL | 0 refills | Status: AC

## 2023-01-09 NOTE — Discharge Instructions (Signed)
I have prescribed you prednisone to decrease inflammation with your shoulder pain.  Follow-up with orthopedics if pain persists or worsens.

## 2023-01-09 NOTE — ED Triage Notes (Signed)
Pt states that he has some right shoulder pain. X3-4 days  Pt denies any known injury.

## 2023-01-09 NOTE — ED Provider Notes (Signed)
[name]    CSN: [id number] Arrival date & time: 01/09/23  1032      History   Chief Complaint Chief Complaint  Patient presents with   Shoulder Pain    X3-4 days    HPI Max Leach is a 42 y.o. male.   Patient presents with right shoulder pain that started about 3 to 4 days ago.  Patient has had previous shoulder pain in that area but states that this feels "a lot sharper".  Denies any injury to the area.  He works doing a lot of repetitive movements of his hands and upper extremities.  He has taken Tylenol with minimal improvement.   Shoulder Pain   Past Medical History:  Diagnosis Date   Arthritis    r knee   Hypertension     Patient Active Problem List   Diagnosis Date Noted   Primary osteoarthritis of right knee 02/08/2022   Tendinitis 11/21/2015    Past Surgical History:  Procedure Laterality Date   KNEE ARTHROPLASTY Right        Home Medications    Prior to Admission medications   Medication Sig Start Date End Date Taking? Authorizing Provider  meloxicam (MOBIC) 15 MG tablet Take 15 mg by mouth daily. 01/18/21  Yes [provider]  predniSONE (DELTASONE) 20 MG tablet Take 2 tablets (40 mg total) by mouth daily for 5 days. 01/09/23 01/14/23 Yes Abhay Godbolt, Acie Fredrickson, FNP  cetirizine (ZYRTEC ALLERGY) 10 MG tablet Take 1 tablet (10 mg total) by mouth at bedtime. 07/09/21 10/07/21  Theadora Rama Scales, PA-C  doxycycline (VIBRAMYCIN) 100 MG capsule Take 1 capsule (100 mg total) by mouth 2 (two) times daily. 01/12/22   Tomi Bamberger, PA-C  fluticasone (FLONASE) 50 MCG/ACT nasal spray Place 2 sprays into both nostrils daily. 07/09/21   Theadora Rama Scales, PA-C  Furosemide (LASIX PO) Take by mouth.    [provider]  meloxicam (MOBIC) 15 MG tablet Take one pill a day with food for 7 days and then prn thereafter 02/08/22   Claudie Leach, DO    Family History Family History  Problem Relation Age of Onset   Arthritis  Mother     Social History Social History   Tobacco Use   Smoking status: Former    Types: Cigarettes   Smokeless tobacco: Current  Vaping Use   Vaping status: Never Used  Substance Use Topics   Alcohol use: Not Currently   Drug use: Yes    Types: Marijuana     Allergies   Shrimp [shellfish allergy]   Review of Systems Review of Systems Per HPI  Physical Exam Triage Vital Signs ED Triage Vitals  Encounter Vitals Group     BP 01/09/23 1354 123/74     Systolic BP Percentile --      Diastolic BP Percentile --      Pulse Rate 01/09/23 1354 63     Resp 01/09/23 1354 17     Temp 01/09/23 1354 97.8 F (36.6 C)     Temp Source 01/09/23 1354 Oral     SpO2 01/09/23 1354 96 %     Weight --      Height 01/09/23 1352 6\' 2"  (1.88 m)     Head Circumference --      Peak Flow --      Pain Score 01/09/23 1352 2     Pain Loc --      Pain Education --  Exclude from Growth Chart --    No data found.  Updated Vital Signs BP 123/74 (BP Location: Left Arm)   Pulse 63   Temp 97.8 F (36.6 C) (Oral)   Resp 17   Ht 6\' 2"  (1.88 m)   SpO2 96%   BMI 36.34 kg/m   Visual Acuity Right Eye Distance:   Left Eye Distance:   Bilateral Distance:    Right Eye Near:   Left Eye Near:    Bilateral Near:     Physical Exam Constitutional:      General: He is not in acute distress.    Appearance: Normal appearance. He is not toxic-appearing or diaphoretic.  HENT:     Head: Normocephalic and atraumatic.  Eyes:     Extraocular Movements: Extraocular movements intact.     Conjunctiva/sclera: Conjunctivae normal.  Pulmonary:     Effort: Pulmonary effort is normal.  Musculoskeletal:     Comments: Patient has mild tenderness to palpation to lateral right shoulder and posterior shoulder.  No obvious swelling noted.  Full range of motion of shoulder present.  Grip strength 5/5.  Appears to be neurovascularly intact.  Neurological:     General: No focal deficit present.     Mental  Status: He is alert and oriented to person, place, and time. Mental status is at baseline.  Psychiatric:        Mood and Affect: Mood normal.        Behavior: Behavior normal.        Thought Content: Thought content normal.        Judgment: Judgment normal.      UC Treatments / Results  Labs (all labs ordered are listed, but only abnormal results are displayed) Labs Reviewed - No data to display  EKG   Radiology No results found.  Procedures Procedures (including critical care time)  Medications Ordered in UC Medications - No data to display  Initial Impression / Assessment and Plan / UC Course  I have reviewed the triage vital signs and the nursing notes.  Pertinent labs & imaging results that were available during my care of the patient were reviewed by me and considered in my medical decision making (see chart for details).     Given no injury or direct bony tenderness, imaging was deferred.  Patient has had pain in this area of the shoulder previously so could be acute on chronic pain.  Suspect possible muscular inflammation.  Will treat with prednisone to decrease inflammation.  Patient has taken prednisone previously and tolerated well.  Advised follow-up with orthopedist at provided contact if symptoms persist or worsen.  Patient verbalized understanding and was agreeable with plan. Final Clinical Impressions(s) / UC Diagnoses   Final diagnoses:  Acute pain of right shoulder     Discharge Instructions      I have prescribed you prednisone to decrease inflammation with your shoulder pain.  Follow-up with orthopedics if pain persists or worsens.    ED Prescriptions     Medication Sig Dispense Auth. Provider   predniSONE (DELTASONE) 20 MG tablet Take 2 tablets (40 mg total) by mouth daily for 5 days. 10 tablet Gustavus Bryant, [place]      PDMP not reviewed this encounter.   Gustavus Bryant, [place] 01/09/23 1441

## 2023-01-17 ENCOUNTER — Encounter (HOSPITAL_COMMUNITY): Payer: Self-pay

## 2023-01-17 ENCOUNTER — Ambulatory Visit (HOSPITAL_COMMUNITY)
Admission: EM | Admit: 2023-01-17 | Discharge: 2023-01-17 | Disposition: A | Discharge status: Home / Self Care | Payer: 59 | Attending: Physician Assistant | Admitting: Physician Assistant

## 2023-01-17 DIAGNOSIS — L089 Local infection of the skin and subcutaneous tissue, unspecified: Secondary | ICD-10-CM | POA: Diagnosis not present

## 2023-01-17 DIAGNOSIS — S61233A Puncture wound without foreign body of left middle finger without damage to nail, initial encounter: Secondary | ICD-10-CM | POA: Diagnosis not present

## 2023-01-17 DIAGNOSIS — Z23 Encounter for immunization: Secondary | ICD-10-CM

## 2023-01-17 MED ORDER — TETANUS-DIPHTH-ACELL PERTUSSIS 5-2.5-18.5 LF-MCG/0.5 IM SUSY
PREFILLED_SYRINGE | INTRAMUSCULAR | Status: AC
  Filled 2023-01-17: qty 0.5

## 2023-01-17 MED ORDER — LIDOCAINE HCL (PF) 1 % IJ SOLN
INTRAMUSCULAR | Status: AC
  Filled 2023-01-17: qty 4

## 2023-01-17 MED ORDER — CEFTRIAXONE SODIUM 1 G IJ SOLR
1.0000 g | Freq: Once | INTRAMUSCULAR | Status: AC
  Administered 2023-01-17: 1 g via INTRAMUSCULAR

## 2023-01-17 MED ORDER — SULFAMETHOXAZOLE-TRIMETHOPRIM 800-160 MG PO TABS: 1.0000 | ORAL_TABLET | Freq: Two times a day (BID) | ORAL | 0 refills | Status: AC

## 2023-01-17 MED ORDER — TETANUS-DIPHTH-ACELL PERTUSSIS 5-2.5-18.5 LF-MCG/0.5 IM SUSY
0.5000 mL | PREFILLED_SYRINGE | Freq: Once | INTRAMUSCULAR | Status: AC
  Administered 2023-01-17: 0.5 mL via INTRAMUSCULAR

## 2023-01-17 MED ORDER — CEFTRIAXONE SODIUM 1 G IJ SOLR
INTRAMUSCULAR | Status: AC
  Filled 2023-01-17: qty 10

## 2023-01-17 MED ORDER — CEPHALEXIN 500 MG PO CAPS: 500.0000 mg | ORAL_CAPSULE | Freq: Four times a day (QID) | ORAL | 0 refills | Status: DC

## 2023-01-17 NOTE — Discharge Instructions (Signed)
We gave an injection of antibiotics today.  Please start cephalexin 500 mg 4 times daily.  Take this with food as it can upset your stomach.  Also start Bactrim DS twice daily.  If you develop any rash or oral lesions stop the medication to be seen immediately.  We updated your tetanus.  Follow-up with hand specialist as soon as possible.  Call them to schedule an appointment as soon as possible.  You can use over-the-counter medications including Tylenol and ibuprofen for pain.  If anything changes and you have increasing pain, swelling, redness, fever, nausea, vomiting, numbness or tingling in the finger you need to go to the emergency room immediately.  If your symptoms are not improving within 1 to 2 days of starting the antibiotics you need to be seen again.  As we discussed, finger infections can become serious so if anything changes please be reevaluated.

## 2023-01-17 NOTE — ED Triage Notes (Signed)
Pt presents to the office for L-hand middle finger pain and swelling. Pt reports he was cleaning chicken and the chicken cut his finger.

## 2023-01-17 NOTE — ED Provider Notes (Signed)
[facility]    CSN: [id number] Arrival date & time: 01/17/23  0847      History   Chief Complaint Chief Complaint  Patient presents with   Finger Injury    HPI Max Leach is a 42 y.o. male.   Patient presents today with a several day history of swelling of his left middle finger.  Reports that he was at work when a piece of chicken bone cut his finger.  Initially he did not have any significant pain and clean the area.  Over the past several days it has become more painful and swollen.  He reports that pain is rated 6 on a 0-10 pain scale, described as throbbing, worse with palpation, no alleviating factors identified.  He denies any numbness or paresthesias.  Denies fever, nausea, vomiting.  He is right-handed.  He denies any recent antibiotic use.  Denies risk factors for MRSA including recent hospitalization.    Past Medical History:  Diagnosis Date   Arthritis    r knee   Hypertension     Patient Active Problem List   Diagnosis Date Noted   Primary osteoarthritis of right knee 02/08/2022   Tendinitis 11/21/2015    Past Surgical History:  Procedure Laterality Date   KNEE ARTHROPLASTY Right        Home Medications    Prior to Admission medications   Medication Sig Start Date End Date Taking? Authorizing Provider  cephALEXin (KEFLEX) 500 MG capsule Take 1 capsule (500 mg total) by mouth 4 (four) times daily. 01/17/23  Yes Freida Nebel K, PA-C  sulfamethoxazole-trimethoprim (BACTRIM DS) 800-160 MG tablet Take 1 tablet by mouth 2 (two) times daily for 7 days. 01/17/23 01/24/23 Yes Kendrew Paci, Noberto Retort, PA-C  cetirizine (ZYRTEC ALLERGY) 10 MG tablet Take 1 tablet (10 mg total) by mouth at bedtime. 07/09/21 10/07/21  Theadora Rama Scales, PA-C  fluticasone (FLONASE) 50 MCG/ACT nasal spray Place 2 sprays into both nostrils daily. 07/09/21   Theadora Rama Scales, PA-C  Furosemide (LASIX PO) Take by mouth.    [provider]  meloxicam (MOBIC) 15 MG  tablet Take 15 mg by mouth daily. 01/18/21   [provider]  meloxicam (MOBIC) 15 MG tablet Take one pill a day with food for 7 days and then prn thereafter 02/08/22   Claudie Leach, DO    Family History Family History  Problem Relation Age of Onset   Arthritis Mother     Social History Social History   Tobacco Use   Smoking status: Former    Types: Cigarettes   Smokeless tobacco: Current  Vaping Use   Vaping status: Never Used  Substance Use Topics   Alcohol use: Not Currently   Drug use: Yes    Types: Marijuana     Allergies   Shrimp [shellfish allergy]   Review of Systems Review of Systems  Constitutional:  Positive for activity change. Negative for appetite change, fatigue and fever.  Gastrointestinal:  Negative for abdominal pain, diarrhea, nausea and vomiting.  Musculoskeletal:  Positive for arthralgias and joint swelling. Negative for myalgias.  Skin:  Negative for color change and wound.  Neurological:  Negative for weakness and numbness.     Physical Exam Triage Vital Signs ED Triage Vitals [01/17/23 0943]  Encounter Vitals Group     BP 135/83     Systolic BP Percentile      Diastolic BP Percentile      Pulse Rate (!) 55     Resp  16     Temp (!) 97.4 F (36.3 C)     Temp Source Oral     SpO2 98 %     Weight      Height      Head Circumference      Peak Flow      Pain Score      Pain Loc      Pain Education      Exclude from Growth Chart    No data found.  Updated Vital Signs BP 135/83 (BP Location: Left Arm)   Pulse (!) 55   Temp (!) 97.4 F (36.3 C) (Oral)   Resp 16   SpO2 98%   Visual Acuity Right Eye Distance:   Left Eye Distance:   Bilateral Distance:    Right Eye Near:   Left Eye Near:    Bilateral Near:     Physical Exam Vitals reviewed.  Constitutional:      General: He is awake.     Appearance: Normal appearance. He is well-developed. He is not ill-appearing.     Comments: Very pleasant male appear  stated age in no acute distress sitting comfortably in exam room  HENT:     Head: Normocephalic and atraumatic.     Mouth/Throat:     Pharynx: No oropharyngeal exudate, posterior oropharyngeal erythema or uvula swelling.  Cardiovascular:     Rate and Rhythm: Normal rate and regular rhythm.     Heart sounds: Normal heart sounds, S1 normal and S2 normal. No murmur heard. Pulmonary:     Effort: Pulmonary effort is normal.     Breath sounds: Normal breath sounds. No stridor. No wheezing, rhonchi or rales.     Comments: Clear to auscultation bilaterally Musculoskeletal:     Left hand: Swelling and tenderness present. No bony tenderness. Decreased range of motion. There is no disruption of two-point discrimination. Normal capillary refill.     Comments: Left middle finger: Redness and swelling at distal finger with radiation proximally.  No extension into palmar hand.  Hand is neurovascularly intact.  Normal to point discrimination.  Capillary fill within 2 seconds.  Decreased range of motion with flexion of finger secondary to pain.  Normal pincer and grip strength.  No wound on exam.  Neurological:     Mental Status: He is alert.  Psychiatric:        Behavior: Behavior is cooperative.      UC Treatments / Results  Labs (all labs ordered are listed, but only abnormal results are displayed) Labs Reviewed - No data to display  EKG   Radiology No results found.  Procedures Procedures (including critical care time)  Medications Ordered in UC Medications  cefTRIAXone (ROCEPHIN) injection 1 g (has no administration in time range)  Tdap (BOOSTRIX) injection 0.5 mL (has no administration in time range)    Initial Impression / Assessment and Plan / UC Course  I have reviewed the triage vital signs and the nursing notes.  Pertinent labs & imaging results that were available during my care of the patient were reviewed by me and considered in my medical decision making (see chart for  details).     Patient is well-appearing, afebrile, nontoxic, nontachycardic.  Concern for infection likely triggered by puncture wound.  He was given a gram of Rocephin in clinic and started on cephalexin and Bactrim DS.  We discussed that he should take this medication with food as it can upset his stomach as well as monitor for  any side effects including rash or oral lesions.  He is unsure when his last tetanus was so this was updated given the puncture wound.  No evidence of tenosynovitis on exam today but we did discuss that if anything changes or worsens and he has extension of redness or pain into his palm he needs to be seen immediately.  I did recommend that he follow-up with a hand specialist and he is to call, schedule appointment as soon as possible.  He can use over-the-counter medications as needed for pain.  X-rays were deferred as his symptoms only be present for a few days and unlikely to pick up osteomyelitis.  He has no concern for retained foreign body and denies any recent trauma.  If he has any additional symptoms including increasing redness, swelling, pain, fever, numbness or paresthesias in the finger, nausea/vomiting, weakness he needs to go to the emergency room immediately to which she expressed understanding.  Strict return precautions given.  Work excuse note provided.  Final Clinical Impressions(s) / UC Diagnoses   Final diagnoses:  Finger infection  Puncture wound of left middle finger     Discharge Instructions      We gave an injection of antibiotics today.  Please start cephalexin 500 mg 4 times daily.  Take this with food as it can upset your stomach.  Also start Bactrim DS twice daily.  If you develop any rash or oral lesions stop the medication to be seen immediately.  We updated your tetanus.  Follow-up with hand specialist as soon as possible.  Call them to schedule an appointment as soon as possible.  You can use over-the-counter medications including Tylenol  and ibuprofen for pain.  If anything changes and you have increasing pain, swelling, redness, fever, nausea, vomiting, numbness or tingling in the finger you need to go to the emergency room immediately.  If your symptoms are not improving within 1 to 2 days of starting the antibiotics you need to be seen again.  As we discussed, finger infections can become serious so if anything changes please be reevaluated.     ED Prescriptions     Medication Sig Dispense Auth. Provider   cephALEXin (KEFLEX) 500 MG capsule Take 1 capsule (500 mg total) by mouth 4 (four) times daily. 28 capsule Gisella Alwine K, PA-C   sulfamethoxazole-trimethoprim (BACTRIM DS) 800-160 MG tablet Take 1 tablet by mouth 2 (two) times daily for 7 days. 14 tablet Ridhi Hoffert, Noberto Retort, PA-C      PDMP not reviewed this encounter.   Jeani Hawking, PA-C 01/17/23 1022

## 2023-08-06 ENCOUNTER — Ambulatory Visit
Admission: EM | Admit: 2023-08-06 | Discharge: 2023-08-06 | Disposition: A | Discharge status: Home / Self Care | Attending: Internal Medicine | Admitting: Internal Medicine

## 2023-08-06 ENCOUNTER — Encounter: Payer: Self-pay | Admitting: Emergency Medicine

## 2023-08-06 DIAGNOSIS — H6001 Abscess of right external ear: Secondary | ICD-10-CM | POA: Diagnosis not present

## 2023-08-06 MED ORDER — DOXYCYCLINE HYCLATE 100 MG PO CAPS: 100.0000 mg | ORAL_CAPSULE | Freq: Two times a day (BID) | ORAL | 0 refills | Status: AC

## 2023-08-06 NOTE — ED Provider Notes (Signed)
 [name]    CSN: [id number] Arrival date & time: 08/06/23  7846      History   Chief Complaint Chief Complaint  Patient presents with   Insect Bite    HPI Max Leach is a 43 y.o. male who presents with possible insect bite in R ear canal x 3 days. Has had an abscess somewhere else in the past, but does not know if he has hx of MRSA.     Past Medical History:  Diagnosis Date   Arthritis    r knee   Hypertension     Patient Active Problem List   Diagnosis Date Noted   Primary osteoarthritis of right knee 02/08/2022   Tendinitis 11/21/2015    Past Surgical History:  Procedure Laterality Date   KNEE ARTHROPLASTY Right        Home Medications    Prior to Admission medications   Medication Sig Start Date End Date Taking? Authorizing Provider  doxycycline (VIBRAMYCIN) 100 MG capsule Take 1 capsule (100 mg total) by mouth 2 (two) times daily. 08/06/23  Yes Rodriguez-Southworth, Nettie Elm, PA-C  cetirizine (ZYRTEC ALLERGY) 10 MG tablet Take 1 tablet (10 mg total) by mouth at bedtime. 07/09/21 10/07/21  Theadora Rama Scales, PA-C  fluticasone (FLONASE) 50 MCG/ACT nasal spray Place 2 sprays into both nostrils daily. 07/09/21   Theadora Rama Scales, PA-C  Furosemide (LASIX PO) Take by mouth.    [provider]  meloxicam (MOBIC) 15 MG tablet Take 15 mg by mouth daily. 01/18/21   [provider]  meloxicam (MOBIC) 15 MG tablet Take one pill a day with food for 7 days and then prn thereafter 02/08/22   Claudie Leach, DO    Family History Family History  Problem Relation Age of Onset   Arthritis Mother     Social History Social History   Tobacco Use   Smoking status: Former    Types: Cigarettes   Smokeless tobacco: Current  Vaping Use   Vaping status: Never Used  Substance Use Topics   Alcohol use: Not Currently   Drug use: Yes    Types: Marijuana     Allergies   Shrimp [shellfish allergy]   Review of  Systems Review of Systems  As noted in HPI  Physical Exam Triage Vital Signs ED Triage Vitals  Encounter Vitals Group     BP 08/06/23 0842 (!) 147/95     Systolic BP Percentile --      Diastolic BP Percentile --      Pulse Rate 08/06/23 0842 80     Resp 08/06/23 0842 18     Temp 08/06/23 0842 98.1 F (36.7 C)     Temp Source 08/06/23 0842 Oral     SpO2 08/06/23 0842 95 %     Weight 08/06/23 0841 283 lb 1.1 oz (128.4 kg)     Height --      Head Circumference --      Peak Flow --      Pain Score 08/06/23 0840 8     Pain Loc --      Pain Education --      Exclude from Growth Chart --    No data found.  Updated Vital Signs BP (!) 147/95 (BP Location: Left Arm)   Pulse 80   Temp 98.1 F (36.7 C) (Oral)   Resp 18   Wt 283 lb 1.1 oz (128.4 kg)   SpO2 95%   BMI 36.34 kg/m   Visual  Acuity Right Eye Distance:   Left Eye Distance:   Bilateral Distance:    Right Eye Near:   Left Eye Near:    Bilateral Near:     Physical Exam Vitals and nursing note reviewed.  Constitutional:      General: He is not in acute distress.    Appearance: He is obese. He is not toxic-appearing.  HENT:     Ears:     Comments: R ear canal with 1 cm x 1 cm abscess which is indurated Eyes:     General: No scleral icterus.    Conjunctiva/sclera: Conjunctivae normal.  Pulmonary:     Effort: Pulmonary effort is normal.  Musculoskeletal:        General: Normal range of motion.     Cervical back: Neck supple.  Skin:    General: Skin is warm and dry.     Findings: No rash.  Neurological:     Mental Status: He is alert and oriented to person, place, and time.     Gait: Gait normal.  Psychiatric:        Mood and Affect: Mood normal.        Behavior: Behavior normal.        Thought Content: Thought content normal.        Judgment: Judgment normal.    UC Treatments / Results  Labs (all labs ordered are listed, but only abnormal results are displayed) Labs Reviewed - No data to  display  EKG   Radiology No results found.  Procedures Procedures (including critical care time)  Medications Ordered in UC Medications - No data to display  Initial Impression / Assessment and Plan / UC Course  I have reviewed the triage vital signs and the nursing notes.  R ear canal abscess  I placed him on Doxy as noted Advised to apply warm heat using a Q-tip and was educated how to do it.   Final Clinical Impressions(s) / UC Diagnoses   Final diagnoses:  Abscess, ear canal, right     Discharge Instructions      Apply heat on the ares for 10 minutes 3-4 times a day for 3-5 days or til the area drains puss or pain gets better.      ED Prescriptions     Medication Sig Dispense Auth. Provider   doxycycline (VIBRAMYCIN) 100 MG capsule Take 1 capsule (100 mg total) by mouth 2 (two) times daily. 20 capsule Rodriguez-Southworth, Nettie Elm, PA-C      PDMP not reviewed this encounter.   Garey Ham, [place] 08/06/23 [phone]

## 2023-08-06 NOTE — Discharge Instructions (Signed)
 Apply heat on the ares for 10 minutes 3-4 times a day for 3-5 days or til the area drains puss or pain gets better.

## 2023-08-06 NOTE — ED Triage Notes (Signed)
 Pt presents with bug bite inside right ear x 3 days.
